# Patient Record
Sex: Female | Born: 1985
Health system: Southern US, Community
[De-identification: ages and names within clinical notes are randomized; demographics above are authoritative.]

## PROBLEM LIST (undated history)

## (undated) ENCOUNTER — Inpatient Hospital Stay (HOSPITAL_COMMUNITY): Payer: Self-pay

## (undated) DIAGNOSIS — Z9889 Other specified postprocedural states: Secondary | ICD-10-CM

## (undated) DIAGNOSIS — Z8619 Personal history of other infectious and parasitic diseases: Secondary | ICD-10-CM

## (undated) DIAGNOSIS — D473 Essential (hemorrhagic) thrombocythemia: Secondary | ICD-10-CM

## (undated) DIAGNOSIS — R87629 Unspecified abnormal cytological findings in specimens from vagina: Secondary | ICD-10-CM

## (undated) DIAGNOSIS — D75839 Thrombocytosis, unspecified: Secondary | ICD-10-CM

## (undated) HISTORY — PX: WISDOM TOOTH EXTRACTION: SHX21

---

## 2006-11-23 HISTORY — PX: LEEP: SHX91

## 2006-11-23 HISTORY — PX: DILATION AND CURETTAGE OF UTERUS: SHX78

## 2014-05-24 LAB — OB RESULTS CONSOLE ABO/RH: RH TYPE: POSITIVE

## 2014-05-24 LAB — OB RESULTS CONSOLE HIV ANTIBODY (ROUTINE TESTING): HIV: NONREACTIVE

## 2014-05-24 LAB — OB RESULTS CONSOLE RPR: RPR: NONREACTIVE

## 2014-05-24 LAB — OB RESULTS CONSOLE RUBELLA ANTIBODY, IGM: Rubella: IMMUNE

## 2014-05-24 LAB — OB RESULTS CONSOLE ANTIBODY SCREEN: ANTIBODY SCREEN: NEGATIVE

## 2014-08-03 ENCOUNTER — Inpatient Hospital Stay (HOSPITAL_COMMUNITY): Payer: BC Managed Care – PPO

## 2014-08-03 ENCOUNTER — Encounter (HOSPITAL_COMMUNITY): Payer: Self-pay | Admitting: *Deleted

## 2014-08-03 ENCOUNTER — Inpatient Hospital Stay (HOSPITAL_COMMUNITY)
Admission: AD | Admit: 2014-08-03 | Discharge: 2014-08-03 | Disposition: A | Discharge status: Home / Self Care | Payer: BC Managed Care – PPO | Source: Ambulatory Visit | Attending: Obstetrics & Gynecology | Admitting: Obstetrics & Gynecology

## 2014-08-03 DIAGNOSIS — O26879 Cervical shortening, unspecified trimester: Secondary | ICD-10-CM | POA: Diagnosis not present

## 2014-08-03 DIAGNOSIS — O4402 Placenta previa specified as without hemorrhage, second trimester: Secondary | ICD-10-CM

## 2014-08-03 DIAGNOSIS — R102 Pelvic and perineal pain: Secondary | ICD-10-CM

## 2014-08-03 DIAGNOSIS — N949 Unspecified condition associated with female genital organs and menstrual cycle: Secondary | ICD-10-CM | POA: Insufficient documentation

## 2014-08-03 DIAGNOSIS — O26872 Cervical shortening, second trimester: Secondary | ICD-10-CM

## 2014-08-03 DIAGNOSIS — O26892 Other specified pregnancy related conditions, second trimester: Secondary | ICD-10-CM

## 2014-08-03 DIAGNOSIS — O4400 Placenta previa specified as without hemorrhage, unspecified trimester: Secondary | ICD-10-CM | POA: Insufficient documentation

## 2014-08-03 LAB — URINALYSIS, ROUTINE W REFLEX MICROSCOPIC
Bilirubin Urine: NEGATIVE
Glucose, UA: NEGATIVE mg/dL
Hgb urine dipstick: NEGATIVE
Ketones, ur: NEGATIVE mg/dL
Leukocytes, UA: NEGATIVE
Nitrite: NEGATIVE
PH: 6 (ref 5.0–8.0)
Protein, ur: NEGATIVE mg/dL
SPECIFIC GRAVITY, URINE: 1.02 (ref 1.005–1.030)
Urobilinogen, UA: 0.2 mg/dL (ref 0.0–1.0)

## 2014-08-03 NOTE — MAU Note (Signed)
GAVE PB/ GRAHAM CRACKERS/ SPRITE

## 2014-08-03 NOTE — MAU Provider Note (Signed)
  History     CSN: [id number]  Arrival date and time: 08/03/14 1733 Orders placed in EPIC: [place]  First Provider Initiated Contact with Patient 08/03/14 2051      Chief Complaint  Patient presents with  . pelvic pressure    HPI  Ms. Alicia Lloyd is a 28 yo G1P0 female at 19.[redacted] wks gestation with a known placenta previa and shortened cervix presenting today with c/o increased pelvic pressure.  H/O LEEP. She had a CL on 9/8 that measured 2.2 cm.  She is currently on bedrest and take Prometrium 200 mg pv hs.  She denies VB or LOF.  She does report having an increase in vaginal d/c; which is very light brown color.  The vaginal d/c is not something new.  Her primary OB provider at [facility] is Dr. Benjie Karvonen.  History reviewed. No pertinent past medical history.  Past Surgical History  Procedure Laterality Date  . Leep  2008    History reviewed. No pertinent family history.  History  Substance Use Topics  . Smoking status: Never Smoker   . Smokeless tobacco: Never Used  . Alcohol Use: No    Allergies: No Known Allergies  Prescriptions prior to admission  Medication Sig Dispense Refill  . Prenatal Vit-Fe Fumarate-FA (PRENATAL MULTIVITAMIN) TABS tablet Take 1 tablet by mouth daily at 12 noon.      . progesterone (PROMETRIUM) 200 MG capsule Take 200 mg by mouth daily.        Review of Systems  Constitutional: Negative.   HENT: Negative.   Eyes: Negative.   Cardiovascular: Negative.   Gastrointestinal: Negative.   Genitourinary:       Pelvic pressure improved since being in MAU; very light brown watery vaginal d/c  Skin: Negative.   Neurological: Negative.   Endo/Heme/Allergies: Negative.   Psychiatric/Behavioral: Negative.    [place] Mfm Ob Transvaginal Cervical Length: 2.4 cm  Physical Exam   Blood pressure 110/75, pulse 82, temperature 98.1 F (36.7 C), temperature source Oral, resp. rate 18.  Physical Exam  Constitutional: She is oriented to person, place, and time. She  appears well-developed and well-nourished.  HENT:  Head: Normocephalic and atraumatic.  Eyes: Pupils are equal, round, and reactive to light.  Neck: Normal range of motion. Neck supple.  Cardiovascular: Normal rate, regular rhythm and normal heart sounds.   Respiratory: Effort normal and breath sounds normal.  GI: Soft. Bowel sounds are normal.  Genitourinary:  SSE: watery tan colored vaginal d/c; cervix visually closed  Musculoskeletal: Normal range of motion.  Neurological: She is alert and oriented to person, place, and time.  Skin: Skin is warm and dry.  Psychiatric: She has a normal mood and affect. Her behavior is normal. Judgment and thought content normal.    MAU Course  Procedures Transvaginal U/S for CL SSE Assessment and Plan  28 yo G1P0 @ 19.[redacted] wks gestation H/O LEEP affecting pregnancy Shortened cervix Pelvic Pain  Discharge Home Remain on bedrest and pelvic rest Continue vaginal Prometrium hs Keep appt with Dr. Benjie Karvonen 9/14 Call the office for worsening sx's  Dr. Benjie Karvonen notified of assessment / recommends D/C home  Graceann Congress MSN, CNM 08/03/2014, 8:54 PM

## 2014-08-03 NOTE — Discharge Instructions (Signed)
Placenta Previa  Placenta previa is a condition in pregnant women where the placenta implants in the lower part of the uterus. The placenta either partially or completely covers the opening to the cervix. This is a problem because the baby must pass through the cervix during delivery. There are three types of placenta previa. They include:  1. Marginal placenta previa. The placenta is near the cervix, but does not cover the opening. 2. Partial placenta previa. The placenta covers part of the cervical opening. 3. Complete placenta previa. The placenta covers the entire cervical opening.  Depending on the type of placenta previa, there is a chance the placenta may move into a normal position and no longer cover the cervix as the pregnancy progresses. It is important to keep all prenatal visits with your caregiver.  RISK FACTORS You may be more likely to develop placenta previa if you:   Are carrying more than one baby (multiples).   Have an abnormally shaped uterus.   Have scars on the lining of the uterus.   Had previous surgeries involving the uterus, such as a cesarean delivery.   Have delivered a baby previously.   Have a history of placenta previa.   Have smoked or used cocaine during pregnancy.   Are age 28 or older during pregnancy.  SYMPTOMS The main symptom of placenta previa is sudden, painless vaginal bleeding during the second half of pregnancy. The amount of bleeding can be light to very heavy. The bleeding may stop on its own, but almost always returns. Cramping, regular contractions, abdominal pain, and lower back pain can also occur with placenta previa.  DIAGNOSIS Placenta previa can be diagnosed through an ultrasound by finding where the placenta is located. The ultrasound may find placenta previa either during a routine prenatal visit or after vaginal bleeding is noticed. If you are diagnosed with placenta previa, your caregiver may avoid vaginal exams to reduce  the risk of heavy bleeding. There is a chance that placenta previa may not be diagnosed until bleeding occurs during labor.  TREATMENT Specific treatment depends on:   How much you are bleeding or if the bleeding has stopped.  How far along you are in your pregnancy.   The condition of the baby.   The location of the baby and placenta.   The type of placenta previa.  Depending on the factors above, your caregiver may recommend:   Decreased activity.   Bed rest at home or in the hospital.  Pelvic rest. This means no sex, using tampons, douching, pelvic exams, or placing anything into the vagina.  A blood transfusion to replace maternal blood loss.  A cesarean delivery if the bleeding is heavy and cannot be controlled or the placenta completely covers the cervix.  Medication to stop premature labor or mature the fetal lungs if delivery is needed before the pregnancy is full term.  WHEN SHOULD YOU SEEK IMMEDIATE MEDICAL CARE IF YOU ARE SENT HOME WITH PLACENTA PREVIA? Seek immediate medical care if you show any symptoms of placenta previa. You will need to go to the hospital to get checked immediately. Again, those symptoms are:  Sudden, painless vaginal bleeding, even a small amount.  Cramping or regular contractions.  Lower back or abdominal pain. Document Released: 11/09/2005 Document Revised: 07/12/2013 Document Reviewed: 02/10/2013 [facility] Patient Information 2015 [place], [place]. This information is not intended to replace advice given to you by your health care provider. Make sure you discuss any questions you have with your health  care provider.  Pelvic Rest Pelvic rest is sometimes recommended for women when:   The placenta is partially or completely covering the opening of the cervix (placenta previa).  There is bleeding between the uterine wall and the amniotic sac in the first trimester (subchorionic hemorrhage).  The cervix begins to open without labor  starting (incompetent cervix, cervical insufficiency).  The labor is too early (preterm labor). HOME CARE INSTRUCTIONS  Do not have sexual intercourse, stimulation, or an orgasm.  Do not use tampons, douche, or put anything in the vagina.  Do not lift anything over 10 pounds (4.5 kg).  Avoid strenuous activity or straining your pelvic muscles. SEEK MEDICAL CARE IF:  You have any vaginal bleeding during pregnancy. Treat this as a potential emergency.  You have cramping pain felt low in the stomach (stronger than menstrual cramps).  You notice vaginal discharge (watery, mucus, or bloody).  You have a low, dull backache.  There are regular contractions or uterine tightening. SEEK IMMEDIATE MEDICAL CARE IF: You have vaginal bleeding and have placenta previa.  Document Released: 03/06/2011 Document Revised: 02/01/2012 Document Reviewed: 03/06/2011 [facility] Patient Information 2015 [place], [place]. This information is not intended to replace advice given to you by your health care provider. Make sure you discuss any questions you have with your health care provider.

## 2014-08-03 NOTE — MAU Note (Signed)
Pelvic pressure & tightness started last night, went away with rest & hydration.  Hx of placenta previa & short cervix, on bedrest.  Pelvic pressure started again around 1300, denies bleeding.

## 2014-08-04 NOTE — MAU Provider Note (Signed)
Reviewed and agree with note and plan. V.Chevon Fomby, MD  

## 2014-09-25 ENCOUNTER — Encounter (HOSPITAL_COMMUNITY): Payer: Self-pay | Admitting: *Deleted

## 2014-09-28 ENCOUNTER — Encounter (HOSPITAL_COMMUNITY): Payer: Self-pay | Admitting: *Deleted

## 2014-09-28 ENCOUNTER — Inpatient Hospital Stay (HOSPITAL_COMMUNITY)
Admission: AD | Admit: 2014-09-28 | Discharge: 2014-09-28 | Disposition: A | Discharge status: Home / Self Care | Payer: BC Managed Care – PPO | Source: Ambulatory Visit | Attending: Obstetrics and Gynecology | Admitting: Obstetrics and Gynecology

## 2014-09-28 ENCOUNTER — Inpatient Hospital Stay (HOSPITAL_COMMUNITY): Payer: BC Managed Care – PPO

## 2014-09-28 DIAGNOSIS — K5900 Constipation, unspecified: Secondary | ICD-10-CM | POA: Diagnosis not present

## 2014-09-28 DIAGNOSIS — O4420 Partial placenta previa NOS or without hemorrhage, unspecified trimester: Secondary | ICD-10-CM | POA: Insufficient documentation

## 2014-09-28 DIAGNOSIS — O4700 False labor before 37 completed weeks of gestation, unspecified trimester: Secondary | ICD-10-CM | POA: Insufficient documentation

## 2014-09-28 DIAGNOSIS — O479 False labor, unspecified: Secondary | ICD-10-CM | POA: Insufficient documentation

## 2014-09-28 DIAGNOSIS — O99612 Diseases of the digestive system complicating pregnancy, second trimester: Secondary | ICD-10-CM

## 2014-09-28 DIAGNOSIS — O4702 False labor before 37 completed weeks of gestation, second trimester: Secondary | ICD-10-CM

## 2014-09-28 DIAGNOSIS — Z3A27 27 weeks gestation of pregnancy: Secondary | ICD-10-CM | POA: Insufficient documentation

## 2014-09-28 HISTORY — DX: Unspecified abnormal cytological findings in specimens from vagina: R87.629

## 2014-09-28 LAB — FETAL FIBRONECTIN: Fetal Fibronectin: NEGATIVE

## 2014-09-28 NOTE — Discharge Instructions (Signed)
Braxton Hicks Contractions °Contractions of the uterus can occur throughout pregnancy. Contractions are not always a sign that you are in labor.  °WHAT ARE BRAXTON HICKS CONTRACTIONS?  °Contractions that occur before labor are called Braxton Hicks contractions, or false labor. Toward the end of pregnancy (32-34 weeks), these contractions can develop more often and may become more forceful. This is not true labor because these contractions do not result in opening (dilatation) and thinning of the cervix. They are sometimes difficult to tell apart from true labor because these contractions can be forceful and people have different pain tolerances. You should not feel embarrassed if you go to the hospital with false labor. Sometimes, the only way to tell if you are in true labor is for your health care provider to look for changes in the cervix. °If there are no prenatal problems or other health problems associated with the pregnancy, it is completely safe to be sent home with false labor and await the onset of true labor. °HOW CAN YOU TELL THE DIFFERENCE BETWEEN TRUE AND FALSE LABOR? °False Labor °· The contractions of false labor are usually shorter and not as hard as those of true labor.   °· The contractions are usually irregular.   °· The contractions are often felt in the front of the lower abdomen and in the groin.   °· The contractions may go away when you walk around or change positions while lying down.   °· The contractions get weaker and are shorter lasting as time goes on.   °· The contractions do not usually become progressively stronger, regular, and closer together as with true labor.   °True Labor °· Contractions in true labor last 30-70 seconds, become very regular, usually become more intense, and increase in frequency.   °· The contractions do not go away with walking.   °· The discomfort is usually felt in the top of the uterus and spreads to the lower abdomen and low back.   °· True labor can be  determined by your health care provider with an exam. This will show that the cervix is dilating and getting thinner.   °WHAT TO REMEMBER °· Keep up with your usual exercises and follow other instructions given by your health care provider.   °· Take medicines as directed by your health care provider.   °· Keep your regular prenatal appointments.   °· Eat and drink lightly if you think you are going into labor.   °· If Braxton Hicks contractions are making you uncomfortable:   °¨ Change your position from lying down or resting to walking, or from walking to resting.   °¨ Sit and rest in a tub of warm water.   °¨ Drink 2-3 glasses of water. Dehydration may cause these contractions.   °¨ Do slow and deep breathing several times an hour.   °WHEN SHOULD I SEEK IMMEDIATE MEDICAL CARE? °Seek immediate medical care if: °· Your contractions become stronger, more regular, and closer together.   °· You have fluid leaking or gushing from your vagina.   °· You have a fever.   °· You pass blood-tinged mucus.   °· You have vaginal bleeding.   °· You have continuous abdominal pain.   °· You have low back pain that you never had before.   °· You feel your baby's head pushing down and causing pelvic pressure.   °· Your baby is not moving as much as it used to.   °Document Released: 11/09/2005 Document Revised: 11/14/2013 Document Reviewed: 08/21/2013 °[employer]® Patient Information ©2015 [employer]. This information is not intended to replace advice given to you by your health care   provider. Make sure you discuss any questions you have with your health care provider. ° °

## 2014-09-28 NOTE — MAU Note (Signed)
Pt states she has been feeling uc's for the past 3 days, was not sure what they were.  Was seen in MD office, sent to MAU for further evaluation.  Denies bleeding or LOF.

## 2014-09-28 NOTE — MAU Provider Note (Signed)
  History     CSN: [id number]  Arrival date and time: 09/28/14 1603    Chief Complaint  Patient presents with  . Contractions   HPI Comments: G2P0010 @27 .4 wks sent from office after being evaluated for ctx. Having BH ctx x3 days and increased pelvic pressure today, wasn't aware sx were actual ctx until discussed with doula. Pregnancy complicated by hx of LEEP, placenta previa, and shortened cervix. Last CL 3 weeks ago 2.9 cm and stable. Taking vaginal Prometrium. Speculum exam in office visually closed. Reports chronic constipation since pregnant but had BM today.    Past Medical History  Diagnosis Date  . Vaginal Pap smear, abnormal     Past Surgical History  Procedure Laterality Date  . Leep  2008  . Wisdom tooth extraction      History reviewed. No pertinent family history.  History  Substance Use Topics  . Smoking status: Never Smoker   . Smokeless tobacco: Never Used  . Alcohol Use: No    Allergies: No Known Allergies  Prescriptions prior to admission  Medication Sig Dispense Refill Last Dose  . calcium carbonate (TUMS - DOSED IN MG ELEMENTAL CALCIUM) 500 MG chewable tablet Chew 2 tablets by mouth daily as needed for indigestion or heartburn.   Past Week at Unknown time  . Docosahexaenoic Acid (DHA PO) Take 1 tablet by mouth daily.   09/27/2014 at Unknown time  . Prenatal Vit-Fe Fumarate-FA (PRENATAL MULTIVITAMIN) TABS tablet Take 1 tablet by mouth daily at 12 noon.   09/27/2014 at Unknown time  . progesterone (PROMETRIUM) 200 MG capsule Take 200 mg by mouth daily.   09/27/2014 at Unknown time    Review of Systems  Constitutional: Negative.   HENT: Negative.   Eyes: Negative.   Respiratory: Negative.   Cardiovascular: Negative.   Gastrointestinal: Positive for constipation.  Genitourinary: Negative.   Musculoskeletal: Negative.   Skin: Negative.   Neurological: Negative.   Endo/Heme/Allergies: Negative.   Psychiatric/Behavioral: Negative.    Physical Exam    Blood pressure 111/71, pulse 79, temperature 98.4 F (36.9 C), temperature source Oral, resp. rate 18.  Physical Exam  Constitutional: She is oriented to person, place, and time. She appears well-developed and well-nourished.  HENT:  Head: Normocephalic.  Eyes: Pupils are equal, round, and reactive to light.  Neck: Normal range of motion. Neck supple.  Cardiovascular: Normal rate.   Respiratory: Effort normal.  GI: Soft. There is no tenderness.  gravid  Genitourinary:  deferred  Musculoskeletal: Normal range of motion.  Neurological: She is alert and oriented to person, place, and time.  Skin: Skin is warm and dry.  Psychiatric: She has a normal mood and affect.   FHT: 135 bpm, mod variability, +accels, no decels Toco: irregular, irritability  MAU Course  Procedures  MDM Sono for CL and placenta-marginal previa, CL 2.6 cm FFN (collected in office)-negative  Assessment and Plan  27.[redacted] weeks gestation Preterm contractions Constipation  Discharge home Modified BR, pelvic rest Continue Prometrium Mag Oxide 200 mg po 1-2 tabs daily prn constipation Follow-up in office next week as scheduled, OOW until then Discussed A/P with Dr Ronita Hipps, agrees  Julianne Handler, N 09/28/2014, 5:40 PM

## 2014-09-28 NOTE — Progress Notes (Signed)
CNM informed that Dr. Ronita Hipps requests she reevaluate pt & that FFN was ordered.  CNM states she is ordering an U/S & will come see the pt.

## 2014-09-28 NOTE — Progress Notes (Signed)
MD notified of pt status, order received to send FFN, states to monitor pt for one hour & call Colman Cater CNM

## 2014-09-29 DIAGNOSIS — O4700 False labor before 37 completed weeks of gestation, unspecified trimester: Secondary | ICD-10-CM | POA: Insufficient documentation

## 2014-09-29 DIAGNOSIS — O4420 Partial placenta previa NOS or without hemorrhage, unspecified trimester: Secondary | ICD-10-CM | POA: Insufficient documentation

## 2014-09-29 DIAGNOSIS — O479 False labor, unspecified: Secondary | ICD-10-CM | POA: Insufficient documentation

## 2014-09-29 DIAGNOSIS — Z3A27 27 weeks gestation of pregnancy: Secondary | ICD-10-CM | POA: Insufficient documentation

## 2014-12-11 ENCOUNTER — Other Ambulatory Visit: Payer: Self-pay | Admitting: Obstetrics and Gynecology

## 2014-12-13 ENCOUNTER — Encounter (HOSPITAL_COMMUNITY): Payer: Self-pay

## 2014-12-14 ENCOUNTER — Inpatient Hospital Stay (HOSPITAL_COMMUNITY): Admission: RE | Admit: 2014-12-14 | Payer: BC Managed Care – PPO | Source: Ambulatory Visit

## 2014-12-17 ENCOUNTER — Encounter (HOSPITAL_COMMUNITY)
Admission: AD | Disposition: A | Discharge status: Home / Self Care | Payer: Self-pay | Source: Ambulatory Visit | Attending: Obstetrics and Gynecology

## 2014-12-17 ENCOUNTER — Inpatient Hospital Stay (HOSPITAL_COMMUNITY)
Admission: AD | Admit: 2014-12-17 | Discharge: 2014-12-19 | DRG: 765 | Disposition: A | Discharge status: Home / Self Care | Payer: BC Managed Care – PPO | Source: Ambulatory Visit | Attending: Obstetrics and Gynecology | Admitting: Obstetrics and Gynecology

## 2014-12-17 ENCOUNTER — Inpatient Hospital Stay (HOSPITAL_COMMUNITY): Payer: BC Managed Care – PPO | Admitting: Anesthesiology

## 2014-12-17 ENCOUNTER — Encounter (HOSPITAL_COMMUNITY): Payer: Self-pay

## 2014-12-17 DIAGNOSIS — Z3A39 39 weeks gestation of pregnancy: Secondary | ICD-10-CM | POA: Diagnosis present

## 2014-12-17 DIAGNOSIS — O9912 Other diseases of the blood and blood-forming organs and certain disorders involving the immune mechanism complicating childbirth: Secondary | ICD-10-CM | POA: Diagnosis present

## 2014-12-17 DIAGNOSIS — D682 Hereditary deficiency of other clotting factors: Secondary | ICD-10-CM | POA: Diagnosis present

## 2014-12-17 DIAGNOSIS — D6959 Other secondary thrombocytopenia: Secondary | ICD-10-CM | POA: Diagnosis present

## 2014-12-17 DIAGNOSIS — O4403 Placenta previa specified as without hemorrhage, third trimester: Principal | ICD-10-CM | POA: Diagnosis present

## 2014-12-17 HISTORY — DX: Other specified postprocedural states: Z98.890

## 2014-12-17 HISTORY — DX: Essential (hemorrhagic) thrombocythemia: D47.3

## 2014-12-17 HISTORY — DX: Thrombocytosis, unspecified: D75.839

## 2014-12-17 HISTORY — DX: Personal history of other infectious and parasitic diseases: Z86.19

## 2014-12-17 LAB — TYPE AND SCREEN
ABO/RH(D): O POS
Antibody Screen: NEGATIVE

## 2014-12-17 LAB — CBC
HCT: 38.7 % (ref 36.0–46.0)
HEMOGLOBIN: 13.5 g/dL (ref 12.0–15.0)
MCH: 33.2 pg (ref 26.0–34.0)
MCHC: 34.9 g/dL (ref 30.0–36.0)
MCV: 95.1 fL (ref 78.0–100.0)
Platelets: 134 10*3/uL — ABNORMAL LOW (ref 150–400)
RBC: 4.07 MIL/uL (ref 3.87–5.11)
RDW: 12.8 % (ref 11.5–15.5)
WBC: 12.3 10*3/uL — AB (ref 4.0–10.5)

## 2014-12-17 LAB — ABO/RH: ABO/RH(D): O POS

## 2014-12-17 SURGERY — Surgical Case
Anesthesia: Spinal | Site: Abdomen

## 2014-12-17 MED ORDER — NALBUPHINE HCL 10 MG/ML IJ SOLN: 5.0000 mg | Freq: Once | INTRAMUSCULAR | Status: AC | PRN

## 2014-12-17 MED ORDER — MORPHINE SULFATE 0.5 MG/ML IJ SOLN
INTRAMUSCULAR | Status: AC
  Filled 2014-12-17: qty 10

## 2014-12-17 MED ORDER — DIPHENHYDRAMINE HCL 25 MG PO CAPS: 25.0000 mg | ORAL_CAPSULE | Freq: Four times a day (QID) | ORAL | Status: DC | PRN

## 2014-12-17 MED ORDER — ONDANSETRON HCL 4 MG/2ML IJ SOLN
INTRAMUSCULAR | Status: DC | PRN
  Administered 2014-12-17: 4 mg via INTRAVENOUS

## 2014-12-17 MED ORDER — SODIUM CHLORIDE 0.9 % IJ SOLN: 3.0000 mL | Freq: Two times a day (BID) | INTRAMUSCULAR | Status: DC

## 2014-12-17 MED ORDER — FENTANYL CITRATE 0.05 MG/ML IJ SOLN
INTRAMUSCULAR | Status: AC
  Administered 2014-12-17: 50 ug via INTRAVENOUS
  Filled 2014-12-17: qty 2

## 2014-12-17 MED ORDER — LANOLIN HYDROUS EX OINT: 1.0000 "application " | TOPICAL_OINTMENT | CUTANEOUS | Status: DC | PRN

## 2014-12-17 MED ORDER — PHENYLEPHRINE 8 MG IN D5W 100 ML (0.08MG/ML) PREMIX OPTIME
INJECTION | INTRAVENOUS | Status: DC | PRN
  Administered 2014-12-17: 60 ug/min via INTRAVENOUS

## 2014-12-17 MED ORDER — FENTANYL CITRATE 0.05 MG/ML IJ SOLN
INTRAMUSCULAR | Status: DC | PRN
  Administered 2014-12-17: 25 ug via INTRATHECAL

## 2014-12-17 MED ORDER — PRENATAL MULTIVITAMIN CH
1.0000 | ORAL_TABLET | Freq: Every day | ORAL | Status: DC
  Filled 2014-12-17: qty 1

## 2014-12-17 MED ORDER — FENTANYL CITRATE 0.05 MG/ML IJ SOLN
25.0000 ug | INTRAMUSCULAR | Status: DC | PRN
  Administered 2014-12-17: 50 ug via INTRAVENOUS
  Administered 2014-12-17: 0.5 ug via INTRAVENOUS

## 2014-12-17 MED ORDER — BISACODYL 10 MG RE SUPP: 10.0000 mg | Freq: Every day | RECTAL | Status: DC | PRN

## 2014-12-17 MED ORDER — ONDANSETRON HCL 4 MG PO TABS: 4.0000 mg | ORAL_TABLET | ORAL | Status: DC | PRN

## 2014-12-17 MED ORDER — ZOLPIDEM TARTRATE 5 MG PO TABS: 5.0000 mg | ORAL_TABLET | Freq: Every evening | ORAL | Status: DC | PRN

## 2014-12-17 MED ORDER — METHYLERGONOVINE MALEATE 0.2 MG PO TABS: 0.2000 mg | ORAL_TABLET | ORAL | Status: DC | PRN

## 2014-12-17 MED ORDER — OXYCODONE-ACETAMINOPHEN 5-325 MG PO TABS
2.0000 | ORAL_TABLET | ORAL | Status: DC | PRN
  Administered 2014-12-18 – 2014-12-19 (×4): 2 via ORAL
  Filled 2014-12-17 (×5): qty 2

## 2014-12-17 MED ORDER — ONDANSETRON HCL 4 MG/2ML IJ SOLN: 4.0000 mg | Freq: Three times a day (TID) | INTRAMUSCULAR | Status: DC | PRN

## 2014-12-17 MED ORDER — DIBUCAINE 1 % RE OINT: 1.0000 "application " | TOPICAL_OINTMENT | RECTAL | Status: DC | PRN

## 2014-12-17 MED ORDER — OXYTOCIN 10 UNIT/ML IJ SOLN
INTRAMUSCULAR | Status: AC
  Filled 2014-12-17: qty 4

## 2014-12-17 MED ORDER — CEFAZOLIN SODIUM-DEXTROSE 2-3 GM-% IV SOLR
INTRAVENOUS | Status: AC
  Filled 2014-12-17: qty 50

## 2014-12-17 MED ORDER — OXYCODONE-ACETAMINOPHEN 5-325 MG PO TABS
1.0000 | ORAL_TABLET | ORAL | Status: DC | PRN
  Administered 2014-12-18 – 2014-12-19 (×3): 1 via ORAL
  Filled 2014-12-17 (×2): qty 1

## 2014-12-17 MED ORDER — NALOXONE HCL 0.4 MG/ML IJ SOLN: 0.4000 mg | INTRAMUSCULAR | Status: DC | PRN

## 2014-12-17 MED ORDER — DIPHENHYDRAMINE HCL 25 MG PO CAPS
25.0000 mg | ORAL_CAPSULE | ORAL | Status: DC | PRN
  Filled 2014-12-17: qty 1

## 2014-12-17 MED ORDER — SCOPOLAMINE 1 MG/3DAYS TD PT72
1.0000 | MEDICATED_PATCH | Freq: Once | TRANSDERMAL | Status: DC
  Administered 2014-12-17: 1.5 mg via TRANSDERMAL

## 2014-12-17 MED ORDER — SODIUM CHLORIDE 0.9 % IJ SOLN: 3.0000 mL | INTRAMUSCULAR | Status: DC | PRN

## 2014-12-17 MED ORDER — MENTHOL 3 MG MT LOZG: 1.0000 | LOZENGE | OROMUCOSAL | Status: DC | PRN

## 2014-12-17 MED ORDER — MEPERIDINE HCL 25 MG/ML IJ SOLN: 6.2500 mg | INTRAMUSCULAR | Status: DC | PRN

## 2014-12-17 MED ORDER — BUPIVACAINE HCL (PF) 0.25 % IJ SOLN
INTRAMUSCULAR | Status: AC
  Filled 2014-12-17: qty 30

## 2014-12-17 MED ORDER — LACTATED RINGERS IV SOLN
Freq: Once | INTRAVENOUS | Status: AC
  Administered 2014-12-17: 10:00:00 via INTRAVENOUS

## 2014-12-17 MED ORDER — SCOPOLAMINE 1 MG/3DAYS TD PT72
MEDICATED_PATCH | TRANSDERMAL | Status: AC
  Administered 2014-12-17: 1.5 mg via TRANSDERMAL
  Filled 2014-12-17: qty 1

## 2014-12-17 MED ORDER — CEFAZOLIN SODIUM-DEXTROSE 2-3 GM-% IV SOLR
2.0000 g | INTRAVENOUS | Status: AC
  Administered 2014-12-17: 2 g via INTRAVENOUS

## 2014-12-17 MED ORDER — DEXTROSE 5 % IV SOLN
1.0000 ug/kg/h | INTRAVENOUS | Status: DC | PRN
  Filled 2014-12-17: qty 2

## 2014-12-17 MED ORDER — SIMETHICONE 80 MG PO CHEW: 80.0000 mg | CHEWABLE_TABLET | ORAL | Status: DC

## 2014-12-17 MED ORDER — OXYTOCIN 40 UNITS IN LACTATED RINGERS INFUSION - SIMPLE MED
INTRAVENOUS | Status: DC | PRN
  Administered 2014-12-17: 40 [IU] via INTRAVENOUS

## 2014-12-17 MED ORDER — SIMETHICONE 80 MG PO CHEW
80.0000 mg | CHEWABLE_TABLET | Freq: Three times a day (TID) | ORAL | Status: DC
  Filled 2014-12-17: qty 1

## 2014-12-17 MED ORDER — MORPHINE SULFATE (PF) 0.5 MG/ML IJ SOLN
INTRAMUSCULAR | Status: DC | PRN
  Administered 2014-12-17: .15 mg via INTRATHECAL

## 2014-12-17 MED ORDER — BUPIVACAINE IN DEXTROSE 0.75-8.25 % IT SOLN
INTRATHECAL | Status: DC | PRN
Start: 2014-12-17 — End: 2014-12-17
  Administered 2014-12-17: 16 mL via INTRATHECAL

## 2014-12-17 MED ORDER — NALBUPHINE HCL 10 MG/ML IJ SOLN: 5.0000 mg | INTRAMUSCULAR | Status: DC | PRN

## 2014-12-17 MED ORDER — FENTANYL CITRATE 0.05 MG/ML IJ SOLN
INTRAMUSCULAR | Status: AC
  Filled 2014-12-17: qty 2

## 2014-12-17 MED ORDER — METHYLERGONOVINE MALEATE 0.2 MG/ML IJ SOLN: 0.2000 mg | INTRAMUSCULAR | Status: DC | PRN

## 2014-12-17 MED ORDER — FLEET ENEMA 7-19 GM/118ML RE ENEM: 1.0000 | ENEMA | Freq: Every day | RECTAL | Status: DC | PRN

## 2014-12-17 MED ORDER — WITCH HAZEL-GLYCERIN EX PADS: 1.0000 "application " | MEDICATED_PAD | CUTANEOUS | Status: DC | PRN

## 2014-12-17 MED ORDER — SIMETHICONE 80 MG PO CHEW: 80.0000 mg | CHEWABLE_TABLET | ORAL | Status: DC | PRN

## 2014-12-17 MED ORDER — IBUPROFEN 600 MG PO TABS: 600.0000 mg | ORAL_TABLET | Freq: Four times a day (QID) | ORAL | Status: DC

## 2014-12-17 MED ORDER — PHENYLEPHRINE 8 MG IN D5W 100 ML (0.08MG/ML) PREMIX OPTIME
INJECTION | INTRAVENOUS | Status: AC
  Filled 2014-12-17: qty 100

## 2014-12-17 MED ORDER — SODIUM CHLORIDE 0.9 % IV SOLN: 250.0000 mL | INTRAVENOUS | Status: DC

## 2014-12-17 MED ORDER — BUPIVACAINE HCL (PF) 0.25 % IJ SOLN
INTRAMUSCULAR | Status: AC
  Filled 2014-12-17: qty 10

## 2014-12-17 MED ORDER — OXYTOCIN 40 UNITS IN LACTATED RINGERS INFUSION - SIMPLE MED: 62.5000 mL/h | INTRAVENOUS | Status: AC

## 2014-12-17 MED ORDER — DIPHENHYDRAMINE HCL 50 MG/ML IJ SOLN
12.5000 mg | INTRAMUSCULAR | Status: DC | PRN
  Administered 2014-12-17: 12.5 mg via INTRAVENOUS

## 2014-12-17 MED ORDER — ONDANSETRON HCL 4 MG/2ML IJ SOLN: 4.0000 mg | INTRAMUSCULAR | Status: DC | PRN

## 2014-12-17 MED ORDER — ONDANSETRON HCL 4 MG/2ML IJ SOLN
INTRAMUSCULAR | Status: AC
  Filled 2014-12-17: qty 2

## 2014-12-17 MED ORDER — BUPIVACAINE HCL (PF) 0.25 % IJ SOLN
INTRAMUSCULAR | Status: DC | PRN
  Administered 2014-12-17: 6 mL

## 2014-12-17 MED ORDER — LACTATED RINGERS IV SOLN
INTRAVENOUS | Status: DC
  Administered 2014-12-17 (×3): via INTRAVENOUS

## 2014-12-17 MED ORDER — FERROUS SULFATE 325 (65 FE) MG PO TABS
325.0000 mg | ORAL_TABLET | Freq: Two times a day (BID) | ORAL | Status: DC
  Filled 2014-12-17: qty 1

## 2014-12-17 MED ORDER — SENNOSIDES-DOCUSATE SODIUM 8.6-50 MG PO TABS: 2.0000 | ORAL_TABLET | ORAL | Status: DC

## 2014-12-17 MED ORDER — DIPHENHYDRAMINE HCL 50 MG/ML IJ SOLN
INTRAMUSCULAR | Status: AC
  Filled 2014-12-17: qty 1

## 2014-12-17 SURGICAL SUPPLY — 39 items
BARRIER ADHS 3X4 INTERCEED (GAUZE/BANDAGES/DRESSINGS) ×2 IMPLANT
BENZOIN TINCTURE PRP APPL 2/3 (GAUZE/BANDAGES/DRESSINGS) ×2 IMPLANT
CLAMP CORD UMBIL (MISCELLANEOUS) IMPLANT
CLOTH BEACON ORANGE TIMEOUT ST (SAFETY) ×2 IMPLANT
CONTAINER PREFILL 10% NBF 15ML (MISCELLANEOUS) IMPLANT
DECANTER SPIKE VIAL GLASS SM (MISCELLANEOUS) ×2 IMPLANT
DRAPE SHEET LG 3/4 BI-LAMINATE (DRAPES) IMPLANT
DRSG OPSITE POSTOP 4X10 (GAUZE/BANDAGES/DRESSINGS) ×2 IMPLANT
DURAPREP 26ML APPLICATOR (WOUND CARE) ×2 IMPLANT
ELECT REM PT RETURN 9FT ADLT (ELECTROSURGICAL) ×2
ELECTRODE REM PT RTRN 9FT ADLT (ELECTROSURGICAL) ×1 IMPLANT
EXTRACTOR VACUUM M CUP 4 TUBE (SUCTIONS) IMPLANT
GLOVE BIOGEL PI IND STRL 7.0 (GLOVE) ×1 IMPLANT
GLOVE BIOGEL PI INDICATOR 7.0 (GLOVE) ×1
GLOVE ECLIPSE 6.5 STRL STRAW (GLOVE) ×2 IMPLANT
GOWN STRL REUS W/TWL LRG LVL3 (GOWN DISPOSABLE) ×4 IMPLANT
KIT ABG SYR 3ML LUER SLIP (SYRINGE) IMPLANT
NEEDLE HYPO 25X1 1.5 SAFETY (NEEDLE) ×2 IMPLANT
NEEDLE HYPO 25X5/8 SAFETYGLIDE (NEEDLE) IMPLANT
NS IRRIG 1000ML POUR BTL (IV SOLUTION) ×2 IMPLANT
PACK C SECTION WH (CUSTOM PROCEDURE TRAY) ×2 IMPLANT
PAD OB MATERNITY 4.3X12.25 (PERSONAL CARE ITEMS) ×2 IMPLANT
RTRCTR C-SECT PINK 25CM LRG (MISCELLANEOUS) IMPLANT
STAPLER VISISTAT 35W (STAPLE) IMPLANT
STRIP CLOSURE SKIN 1/2X4 (GAUZE/BANDAGES/DRESSINGS) ×2 IMPLANT
SUT CHROMIC GUT AB #0 18 (SUTURE) IMPLANT
SUT MNCRL 0 VIOLET CTX 36 (SUTURE) ×3 IMPLANT
SUT MON AB 4-0 PS1 27 (SUTURE) IMPLANT
SUT MONOCRYL 0 CTX 36 (SUTURE) ×3
SUT PLAIN 2 0 (SUTURE) ×1
SUT PLAIN 2 0 XLH (SUTURE) ×2 IMPLANT
SUT PLAIN ABS 2-0 CT1 27XMFL (SUTURE) ×1 IMPLANT
SUT VIC AB 0 CT1 36 (SUTURE) ×4 IMPLANT
SUT VIC AB 2-0 CT1 27 (SUTURE) ×1
SUT VIC AB 2-0 CT1 TAPERPNT 27 (SUTURE) ×1 IMPLANT
SUT VIC AB 4-0 PS2 27 (SUTURE) ×2 IMPLANT
SYR CONTROL 10ML LL (SYRINGE) ×2 IMPLANT
TOWEL OR 17X24 6PK STRL BLUE (TOWEL DISPOSABLE) ×2 IMPLANT
TRAY FOLEY CATH 14FR (SET/KITS/TRAYS/PACK) IMPLANT

## 2014-12-17 NOTE — Anesthesia Procedure Notes (Signed)
Spinal Patient location during procedure: OR Start time: 12/17/2014 11:36 AM Staffing Anesthesiologist: Bob Daversa A. Performed by: anesthesiologist  Preanesthetic Checklist Completed: patient identified, site marked, surgical consent, pre-op evaluation, timeout performed, IV checked, risks and benefits discussed and monitors and equipment checked Spinal Block Patient position: sitting Prep: site prepped and draped and DuraPrep Patient monitoring: heart rate, cardiac monitor, continuous pulse ox and blood pressure Approach: midline Location: L3-4 Injection technique: single-shot Needle Needle type: Sprotte  Needle gauge: 24 G Needle length: 9 cm Needle insertion depth: 5 cm Assessment Sensory level: T4 Additional Notes Patient tolerated procedure well.. Adequate sensory level.

## 2014-12-17 NOTE — Anesthesia Postprocedure Evaluation (Signed)
Anesthesia Post Note  Patient: Alicia Lloyd  Procedure(s) Performed: Procedure(s) (LRB): Primary CESAREAN SECTION (N/A)  Anesthesia type: Spinal  Patient location: Mother/Baby  Post pain: Pain level controlled  Post assessment: Post-op Vital signs reviewed  Last Vitals:  Filed Vitals:   12/17/14 1630  BP: 114/64  Pulse: 72  Temp: 36.9 C  Resp: 20    Post vital signs: Reviewed  Level of consciousness: awake  Complications: No apparent anesthesia complications

## 2014-12-17 NOTE — Transfer of Care (Signed)
Immediate Anesthesia Transfer of Care Note  Patient: Alicia Lloyd  Procedure(s) Performed: Procedure(s) with comments: Primary CESAREAN SECTION (N/A) - EDD: 12/24/14  Patient Location: PACU  Anesthesia Type:Spinal  Level of Consciousness: awake  Airway & Oxygen Therapy: Patient Spontanous Breathing  Post-op Assessment: Report given to PACU RN  Post vital signs: Reviewed and stable  Complications: No apparent anesthesia complications

## 2014-12-17 NOTE — Anesthesia Preprocedure Evaluation (Signed)
Anesthesia Evaluation  Patient identified by MRN, date of birth, ID band Patient awake    Reviewed: Allergy & Precautions, NPO status , Patient's Chart, lab work & pertinent test results  Airway Mallampati: II       Dental no notable dental hx. (+) Teeth Intact   Pulmonary neg pulmonary ROS,  breath sounds clear to auscultation  Pulmonary exam normal       Cardiovascular negative cardio ROS  Rhythm:Regular Rate:Normal     Neuro/Psych negative neurological ROS  negative psych ROS   GI/Hepatic Neg liver ROS, GERD-  Medicated and Controlled,  Endo/Other  negative endocrine ROS  Renal/GU negative Renal ROS  negative genitourinary   Musculoskeletal negative musculoskeletal ROS (+)   Abdominal (+) + obese,   Peds  Hematology negative hematology ROS (+)   Anesthesia Other Findings   Reproductive/Obstetrics (+) Pregnancy Hx/o placenta previa now low lying placenta Gestational Thromobocytopenia Hx/o 3rd trimester bleeding                             Anesthesia Physical Anesthesia Plan  ASA: II  Anesthesia Plan: Spinal   Post-op Pain Management:    Induction: Intravenous  Airway Management Planned: Natural Airway  Additional Equipment:   Intra-op Plan:   Post-operative Plan: Extubation in OR  Informed Consent: I have reviewed the patients History and Physical, chart, labs and discussed the procedure including the risks, benefits and alternatives for the proposed anesthesia with the patient or authorized representative who has indicated his/her understanding and acceptance.   Dental advisory given  Plan Discussed with: Anesthesiologist, CRNA and Surgeon  Anesthesia Plan Comments:         Anesthesia Quick Evaluation

## 2014-12-17 NOTE — Lactation Note (Addendum)
This note was copied from the chart of [name]. Lactation Consultation Note  Patient Name: [name] [id number] Date: 12/17/2014 Reason for consult: Initial assessment of this mom and baby at 8 hours pp.  This is mom's first baby and she delivered by C/S but she has a private doula who is also an Building services engineer.  [name],,Doula/LC arrived at 2015 but prior to this, [facility] provided [facility] Resource packet and reviewed importance of frequent STS, cue feedings and hand expressed ebm for nipple care and to encourage baby to open wide for latch. LC encouraged review of Baby and Me pp 9, 14 and 20-25 for STS and BF information. LC provided [employer] Resource brochure and reviewed [facility] services and list of community and web site resources.    Maternal Data Formula Feeding for Exclusion: No Has patient been taught Hand Expression?: Yes (LC demonstrated hand expresion technique and encouraged for nipple care and to encourage deep latch) Does the patient have breastfeeding experience prior to this delivery?: No  Feeding Feeding Type: Breast Fed  LATCH Score/Interventions            Initial LATCH score=9 per RN assessment and baby has breastfed twice since delivery          Lactation Tools Discussed/Used    STS, cue feedings, hand expression and nipple care  Consult Status Consult Status: Follow-up Date: 12/18/14 Follow-up type: In-patient    Junious Dresser [facility] 12/17/2014, 8:17 PM

## 2014-12-17 NOTE — Brief Op Note (Signed)
12/17/2014  12:47 PM  PATIENT:  Alicia Lloyd  29 y.o. female  PRE-OPERATIVE DIAGNOSIS:  Low Lying Marginal Placenta, Gestational Thrombocytopenia, History of 3rd trimester bleeding, term gestation  POST-OPERATIVE DIAGNOSIS:  Marginal Placenta, Gestational Thrombocytopenia, History of 3rd trimester bleeding, term gestation  PROCEDURE:  Primary Cesarean section, kerr hysterotomy  SURGEON:  Surgeon(s) and Role:    * [facility] Handyside Clint Bolder, MD - Primary  PHYSICIAN ASSISTANT:   ASSISTANTS: Laury Deep, CNM   ANESTHESIA:   spinal Findings: live female with CAN x 2, nl tubes and ovaries, right small paratubal cyst, marginal placenta.Apgar 9/10 EBL:  Total I/O In: 2400 [I.V.:2400] Out: 800 [Urine:100; Blood:700]  BLOOD ADMINISTERED:none  DRAINS: none   LOCAL MEDICATIONS USED:  MARCAINE     SPECIMEN:  Source of Specimen:  placenta  DISPOSITION OF SPECIMEN:  N/A  COUNTS:  YES  TOURNIQUET:  * No tourniquets in log *  DICTATION: .Other Dictation: Dictation Number [id number]  PLAN OF CARE: Admit to inpatient   PATIENT DISPOSITION:  PACU - hemodynamically stable.   Delay start of Pharmacological VTE agent (>24hrs) due to surgical blood loss or risk of bleeding: no

## 2014-12-17 NOTE — Addendum Note (Signed)
Addendum  created 12/17/14 1706 by Flossie Dibble, CRNA   Modules edited: Notes Section   Notes Section:  File: [id number]

## 2014-12-17 NOTE — Consult Note (Signed)
Neonatology Note:   Attendance at C-section:    I was asked by Dr. Garwin Brothers to attend this primary C/S at 39 weeks due to low-lying placenta. The mother is a G2P0A1 O pos, GBS neg with gestational thrombocytopenia and Factor 5 Leiden deficiency. ROM at delivery, fluid clear. CAN times 2 loosely. Infant vigorous with good spontaneous cry and tone. Needed only minimal bulb suctioning. Ap 9/10. Lungs clear to ausc in DR. To CN to care of Pediatrician.   Real Cons, MD

## 2014-12-17 NOTE — Anesthesia Postprocedure Evaluation (Signed)
Anesthesia Post Note  Patient: Alicia Lloyd  Procedure(s) Performed: Procedure(s) (LRB): Primary CESAREAN SECTION (N/A)  Anesthesia type: Spinal  Patient location: PACU  Post pain: Pain level controlled  Post assessment: Post-op Vital signs reviewed  Last Vitals:  Filed Vitals:   12/17/14 1300  BP:   Pulse: 84  Temp: 36.4 C  Resp: 23    Post vital signs: Reviewed  Level of consciousness: awake  Complications: No apparent anesthesia complications

## 2014-12-18 ENCOUNTER — Encounter (HOSPITAL_COMMUNITY): Payer: Self-pay | Admitting: Obstetrics and Gynecology

## 2014-12-18 LAB — BIRTH TISSUE RECOVERY COLLECTION (PLACENTA DONATION)

## 2014-12-18 LAB — RPR: RPR Ser Ql: NONREACTIVE

## 2014-12-18 LAB — CBC
HCT: 35.9 % — ABNORMAL LOW (ref 36.0–46.0)
Hemoglobin: 12.4 g/dL (ref 12.0–15.0)
MCH: 32.7 pg (ref 26.0–34.0)
MCHC: 34.5 g/dL (ref 30.0–36.0)
MCV: 94.7 fL (ref 78.0–100.0)
Platelets: 138 10*3/uL — ABNORMAL LOW (ref 150–400)
RBC: 3.79 MIL/uL — ABNORMAL LOW (ref 3.87–5.11)
RDW: 13 % (ref 11.5–15.5)
WBC: 13.8 10*3/uL — AB (ref 4.0–10.5)

## 2014-12-18 NOTE — Op Note (Signed)
Alicia Lloyd, Alicia Lloyd NO.:  [id number]  MEDICAL RECORD NO.:  [id number]  LOCATION:  5035                          FACILITY:  [facility]  PHYSICIAN:  Servando Salina, M.D.DATE OF BIRTH:  1986-10-11  DATE OF PROCEDURE:  12/17/2014 DATE OF DISCHARGE:                              OPERATIVE REPORT   PREOPERATIVE DIAGNOSES:  Low-lying/marginal placenta, term gestation.  PROCEDURES: Primary cesarean section, Kerr hysterotomy.  POSTOPERATIVE DIAGNOSES:  Marginal placenta, term gestation.  ANESTHESIA:  Spinal.  SURGEON:  Servando Salina, M.D.  ASSISTANT:  Laury Deep, CNM  DESCRIPTION OF PROCEDURE:  Under adequate spinal anesthesia, the patient was placed in the supine position with a left lateral tilt.  She was sterilely prepped and draped in usual fashion and an indwelling Foley catheter was sterilely placed.  0.25% Marcaine was injected along the planned Pfannenstiel skin incision site.  Pfannenstiel skin incision was then made, carried down to the rectus fascia.  The rectus fascia was opened transversely.  The rectus fascia was then bluntly and sharply dissected off the rectus muscle in a superior and inferior fashion.  The rectus muscle was split in midline.  The parietal peritoneum was entered bluntly and extended.  A developed lower uterine segment was noted.  The vesicouterine peritoneum was opened transversely.  The bladder was bluntly dissected off the lower uterine segment and displaced inferiorly using a bladder retractor.  A curvilinear low-transverse uterine incision was then made and extended bilaterally.  Clear amniotic fluid was noted at the time of rupture of membranes.  Presenting part of vertex, live female, position in the right occiput transverse position with a cord around the neck x2 was noted both of which were reducible. Baby was delivered, bulb suctioned in the abdomen.  Cord was clamped, cut.  The baby was transferred to the awaiting  pediatrician who assigned Apgars of 9 and 10 at one and five minutes respectively.  The placenta on inspection of the uterine cavity was noted to be just at the edge of the junction of the body of the uterus and the cervix.  The placenta was spontaneously delivered intact, not sent to Pathology.  Uterine cavity was cleaned of debris.  Uterine incision had no extension.  It was closed in 2 layers.  The first layer was 0-Monocryl running locked stitch, second layer was imbricated using 0-Monocryl suture.  Abdomen was then copiously irrigated and suctioned.  Normal tubes and ovaries were noted bilaterally.  The right tube had a paratubal cyst, which was not removed.  The abdomen was copiously irrigated and suctioned. Paracolic gutters were cleaned of debris.  Interceed was placed over the lower uterine segment in an inverted T fashion.  The parietal peritoneum was closed with 2-0 Vicryl.  The rectus fascia was then closed with 0- Vicryl x2.  The subcutaneous area was irrigated, small bleeders cauterized.  Interrupted 2-0 plain sutures placed and the skin approximated with 4-0 Vicryl subcuticular sutures and Steri-Strips subsequently placed.  SPECIMENS:  Placenta not sent to Pathology.  ESTIMATED BLOOD LOSS:  700 mL.  INTRAOPERATIVE FLUID:  2400 mL.  URINE OUTPUT:  300 mL.  COUNTS:  Sponge and instrument counts x2 was correct.  COMPLICATIONS:  None.  The patient was transferred to recovery room in stable condition. Baby placed skin to skin.     Servando Salina, M.D.     [place]/[facility]  D:  12/17/2014  T:  12/18/2014  Job:  [id number]

## 2014-12-18 NOTE — Lactation Note (Signed)
This note was copied from the chart of [name]. Lactation Consultation Note  Patient Name: Alicia Lloyd SMOLM'B Date: 12/18/2014 Reason for consult: Follow-up assessment  Baby 23 hours of life. Mom reports everything with BF going well and she does not need assistance. Enc mom to call out for assistance with BF as needed.  Maternal Data    Feeding Feeding Type: Breast Milk Length of feed: 15 min  LATCH Score/Interventions                      Lactation Tools Discussed/Used     Consult Status Consult Status: PRN    Inocente Salles 12/18/2014, 11:26 AM

## 2014-12-18 NOTE — Progress Notes (Signed)
POD # 1  Subjective: Pt reports feeling well/ Pain controlled with Motrin and Percocet Tolerating po/ Foley d/c'd and voiding without problems/ No n/v/ Flatus present Activity: ad lib Bleeding is light Newborn info:  Information for the patient's newborn:  Mohini, Heathcock [[id number]]  female   Circumcision: planning/ Feeding: breast   Objective:  VS:  Filed Vitals:   12/17/14 2215 12/18/14 0200 12/18/14 0215 12/18/14 0530  BP: 77/40 88/50 99/45  100/45  Pulse: 92 85 81 76  Temp:  98.5 F (36.9 C)  98.4 F (36.9 C)  TempSrc:      Resp: 22 18 20 18   Height:      Weight:      SpO2:  96% 95% 95%     I&O: Intake/Output      01/25 0701 - 01/26 0700 01/26 0701 - 01/27 0700   P.O. 1580    I.V. (mL/kg) 2475 (35)    Total Intake(mL/kg) 4055 (57.3)    Urine (mL/kg/hr) 3725    Blood 700    Total Output 4425     Net -370             Recent Labs  12/17/14 0910 12/18/14 0530  WBC 12.3* 13.8*  HGB 13.5 12.4  HCT 38.7 35.9*  PLT 134* 138*    Blood type: --/--/O POS, O POS (01/25 0910) Rubella: Immune (07/02 0000)    Physical Exam:  General: alert and cooperative CV: Regular rate and rhythm Resp: CTA bilaterally Abdomen: soft, nontender, normal bowel sounds Incision: healing well, no erythema, no hernia, no seroma, no swelling, well approximated with suture; moderate drk red drainage to left lateral honeycomb-outlined. Uterine Fundus: firm, below umbilicus, nontender Lochia: minimal Ext: extremities normal, atraumatic, no cyanosis or edema and Homans sign is negative, no sign of DVT, TEDs on    Assessment: POD # 1/ G2P1011/ S/P C/Section d/t low-lying placenta  Gestational thrombocytopenia, delivered-stable Doing well  Plan: Ambulate Continue routine post op orders   Signed: Julianne Handler, N, MSN, CNM 12/18/2014, 9:30 AM

## 2014-12-19 MED ORDER — OXYCODONE-ACETAMINOPHEN 5-325 MG PO TABS: 1.0000 | ORAL_TABLET | ORAL | Status: DC | PRN

## 2014-12-19 MED ORDER — FERROUS SULFATE 325 (65 FE) MG PO TABS: 325.0000 mg | ORAL_TABLET | Freq: Two times a day (BID) | ORAL | Status: DC

## 2014-12-19 MED ORDER — IBUPROFEN 600 MG PO TABS: 600.0000 mg | ORAL_TABLET | Freq: Four times a day (QID) | ORAL | Status: DC

## 2014-12-19 NOTE — Discharge Summary (Signed)
POSTOPERATIVE DISCHARGE SUMMARY:  Patient ID: Alicia Lloyd MRN: [id number] DOB/AGE: [birth year] 29 y.o.  Admit date: 12/17/2014 Admission Diagnoses: Low Lying Marginal Placenta, Gestational Thrombocytopenia, History of 3rd trimester bleeding, term gestation    Discharge date:   Discharge Diagnoses: S/P Primary C/S due to Marginal Placenta, Gestational Thrombocytopenia, History of 3rd trimester bleeding, term gestation on 12/17/2014        Prenatal history: G2P1011   EDC : 12/24/2014, LMP and Ultrasound Has received prenatal care at [facility] since 9.[redacted] wks gestation. Primary provider : Dr. Benjie Karvonen / Dr. Garwin Brothers Prenatal course complicated by Low Lying Marginal Placenta, Gestational Thrombocytopenia, History of 3rd trimester bleeding, term gestation  Prenatal Labs: ABO, Rh:  O POS (01/25 0910)  Antibody: NEG (01/25 0910) Rubella: Immune (07/02 0000)   RPR: Non Reactive (01/25 0910)  HBsAg:  Non-Reactive HIV: Non-reactive (07/02 0000)  GTT : Normal - 118 GBS:  Negative   Medical / Surgical History :  Past medical history:  Past Medical History  Diagnosis Date  . Vaginal Pap smear, abnormal   . Postpartum care following cesarean delivery (1/25) 12/17/2014  . H/O LEEP   . History of HPV infection   . Thrombocythemia     Past surgical history:  Past Surgical History  Procedure Laterality Date  . Leep  2008  . Wisdom tooth extraction    . Dilation and curettage of uterus  2008    TAB  . Cesarean section N/A 12/17/2014    Procedure: Primary CESAREAN SECTION;  Surgeon: Marvene Staff, MD;  Location: [facility];  Service: Obstetrics;  Laterality: N/A;  EDD: 12/24/14     Allergies: Review of patient's allergies indicates no known allergies.   Intrapartum Course:  Admitted for scheduled cesarean delivery d/t Low Lying Marginal Placenta, Gestational Thrombocytopenia, History of 3rd trimester bleeding, term gestation / cesarean delivery of viable female by Dr.  Garwin Brothers - see operative report for details  Physical Exam:   VSS: Blood pressure 111/65, pulse 63, temperature 97.7 F (36.5 C), temperature source Oral, resp. rate 18, height 5\' 6"  (1.676 m), weight 70.761 kg (156 lb), SpO2 95 %, currently breastfeeding.  LABS:  Recent Labs  12/17/14 0910 12/18/14 0530  WBC 12.3* 13.8*  HGB 13.5 12.4  PLT 134* 138*    Newborn Data Live born female on 12/17/2014 Birth Weight: 7 lb 4.6 oz (3305 g) APGAR: 9, 10  See operative report for further details  Home with mother.  Discharge Instructions:  Wound Care: keep clean and dry / remove honeycomb POD 7 Postpartum Instructions: [facility] discharge booklet - instructions reviewed Medications:    Medication List    STOP taking these medications        progesterone 200 MG capsule  Commonly known as:  PROMETRIUM      TAKE these medications        calcium carbonate 500 MG chewable tablet  Commonly known as:  TUMS - dosed in mg elemental calcium  Chew 2 tablets by mouth daily as needed for indigestion or heartburn.     DHA PO  Take 1 tablet by mouth daily.     ferrous sulfate 325 (65 FE) MG tablet  Take 1 tablet (325 mg total) by mouth 2 (two) times daily with a meal.     ibuprofen 600 MG tablet  Commonly known as:  ADVIL,MOTRIN  Take 1 tablet (600 mg total) by mouth every 6 (six) hours.     magnesium gluconate 500 MG tablet  Commonly known as:  MAGONATE  Take 500 mg by mouth daily at 10 pm.     oxyCODONE-acetaminophen 5-325 MG per tablet  Commonly known as:  PERCOCET/ROXICET  Take 1 tablet by mouth every 4 (four) hours as needed (for pain scale less than 7).     prenatal multivitamin Tabs tablet  Take 1 tablet by mouth daily at 12 noon.           Follow-up Information    Follow up with COUSINS,SHERONETTE A, MD. Schedule an appointment as soon as possible for a visit in 6 weeks.   Specialty:  Obstetrics and Gynecology   Why:  postpartum visit   Contact information:   [address] [place] [address] [phone]         Signed: Graceann Congress, MSN, CNM 12/19/2014, 11:35 AM

## 2014-12-19 NOTE — Lactation Note (Signed)
This note was copied from the chart of [place]. Lactation Consultation Note  Patient Name: Alicia Lloyd AVWUJ'W Date: 12/19/2014 Reason for consult: Follow-up assessment;Breast/nipple pain Mom reports BF is going well. She has some nipple tenderness and is using nipple cream. Comfort gels given with instructions. Mom reports milk is coming in. Declines any assist from [facility]. Mom reports she is [employer] and has doula that will assist her if needed.   Maternal Data    Feeding Feeding Type: Breast Fed Length of feed: 30 min  LATCH Score/Interventions                      Lactation Tools Discussed/Used Tools: Comfort gels   Consult Status Consult Status: Complete Date: 12/19/14 Follow-up type: In-patient    Katrine Coho 12/19/2014, 8:34 AM

## 2014-12-19 NOTE — Discharge Instructions (Signed)
Breast Pumping Tips °If you are breastfeeding, there may be times when you cannot feed your baby directly. Returning to work or going on a trip are common examples. Pumping allows you to store breast milk and feed it to your baby later.  °You may not get much milk when you first start to pump. Your breasts should start to make more after a few days. If you pump at the times you usually feed your baby, you may be able to keep making enough milk to feed your baby without also using formula. The more often you pump, the more milk you will produce.  °WHEN SHOULD I PUMP?  °· You can begin to pump soon after delivery. However, some experts recommend waiting about 4 weeks before giving your infant a bottle to make sure breastfeeding is going well.  °· If you plan to return to work, begin pumping a few weeks before. This will help you develop techniques that work best for you. It also lets you build up a supply of breast milk.   °· When you are with your infant, feed on demand and pump after each feeding.   °· When you are away from your infant for several hours, pump for about 15 minutes every 2-3 hours. Pump both breasts at the same time if you can.   °· If your infant has a formula feeding, make sure to pump around the same time.     °· If you drink any alcohol, wait 2 hours before pumping.   °HOW DO I PREPARE TO PUMP? °Your let-down reflex is the natural reaction to stimulation that makes your breast milk flow. It is easier to stimulate this reflex when you are relaxed. Find relaxation techniques that work for you. If you have difficulty with your let-down reflex, try these methods:  °· Smell one of your infant's blankets or an item of clothing.   °· Look at a picture or video of your infant.   °· Sit in a quiet, private space.   °· Massage the breast you plan to pump.   °· Place soothing warmth on the breast.   °· Play relaxing music.   °WHAT ARE SOME GENERAL BREAST PUMPING TIPS? °· Wash your hands before you pump. You  do not need to wash your nipples or breasts. °· There are three ways to pump. °¨ You can use your hand to massage and compress your breast. °¨ You can use a handheld manual pump. °¨ You can use an electric pump.   °· Make sure the suction cup (flange) on the breast pump is the right size. Place the flange directly over the nipple. If it is the wrong size or placed the wrong way, it may be painful and cause nipple damage.   °· If pumping is uncomfortable, apply a small amount of purified or modified lanolin to your nipple and areola. °· If you are using an electric pump, adjust the speed and suction power to be more comfortable. °· If pumping is painful or if you are not getting very much milk, you may need a different type of pump. A lactation consultant can help you determine what type of pump to use.   °· Keep a full water bottle near you at all times. Drinking lots of fluid helps you make more milk.  °· You can store your milk to use later. Pumped breast milk can be stored in a sealable, sterile container or plastic bag. Label all stored breast milk with the date you pumped it. °¨ Milk can stay out at room temperature for up to 8 hours. °¨   You can store your milk in the refrigerator for up to 8 days. °¨ You can store your milk in the freezer for 3 months. Thaw frozen milk using warm water. Do not put it in the microwave. °· Do not smoke. Smoking can lower your milk supply and harm your infant. If you need help quitting, ask your health care provider to recommend a program.   °WHEN SHOULD I CALL MY HEALTH CARE PROVIDER OR A LACTATION CONSULTANT? °· You are having trouble pumping. °· You are concerned that you are not making enough milk. °· You have nipple pain, soreness, or redness. °· You want to use birth control. Birth control pills may lower your milk supply. Talk to your health care provider about your options. °Document Released: 04/29/2010 Document Revised: 11/14/2013 Document Reviewed:  09/01/2013 °[employer]® Patient Information ©2015 [employer]. This information is not intended to replace advice given to you by your health care provider. Make sure you discuss any questions you have with your health care provider. ° °Nutrition for the New Mother  °A new mother needs good health and nutrition so she can have energy to take care of a new baby. Whether a mother breastfeeds or formula feeds the baby, it is important to have a well-balanced diet. Foods from all the food groups should be chosen to meet the new mother's energy needs and to give her the nutrients needed for repair and healing.  °A HEALTHY EATING PLAN °The My Pyramid plan for Moms outlines what you should eat to help you and your baby stay healthy. The energy and amount of food you need depends on whether or not you are breastfeeding. If you are breastfeeding you will need more nutrients. If you choose not to breastfeed, your nutrition goal should be to return to a healthy weight. Limiting calories may be needed if you are not breastfeeding.  °HOME CARE INSTRUCTIONS  °· For a personal plan based on your unique needs, see your Registered Dietitian or visit www.mypyramid.gov. °· Eat a variety of foods. The plan below will help guide you. The following chart has a suggested daily meal plan from the My Pyramid for Moms. °· Eat a variety of fruits and vegetables. °· Eat more dark green and orange vegetables and cooked dried beans. °· Make half your grains whole grains. Choose whole instead of refined grains. °· Choose low-fat or lean meats and poultry. °· Choose low-fat or fat-free dairy products like milk, cheese, or yogurt. °Fruits °· Breastfeeding: 2 cups °· Non-Breastfeeding: 2 cups °· What Counts as a serving? °¨ 1 cup of fruit or juice. °¨ ½ cup dried fruit. °Vegetables °· Breastfeeding: 3 cups °· Non-Breastfeeding: 2 ½ cups °· What Counts as a serving? °¨ 1 cup raw or cooked vegetables. °¨ Juice or 2 cups raw leafy  vegetables. °Grains °· Breastfeeding: 8 oz °· Non-Breastfeeding: 6 oz °· What Counts as a serving? °¨ 1 slice bread. °¨ 1 oz ready-to-eat cereal. °¨ ½ cup cooked pasta, rice, or cereal. °Meat and Beans °· Breastfeeding: 6 ½ oz °· Non-Breastfeeding: 5 ½ oz °· What Counts as a serving? °¨ 1 oz lean meat, poultry, or fish °¨ ¼ cup cooked dry beans °¨ ½ oz nuts or 1 egg °¨ 1 tbs peanut butter °Milk °· Breastfeeding: 3 cups °· Non-Breastfeeding: 3 cups °· What Counts as a serving? °¨ 1 cup milk. °¨ 8 oz yogurt. °¨ 1 ½ oz cheese. °¨ 2 oz processed cheese. °TIPS FOR THE BREASTFEEDING MOM °· Rapid weight   loss is not suggested when you are breastfeeding. By simply breastfeeding, you will be able to lose the weight gained during your pregnancy. Your caregiver can keep track of your weight and tell you if your weight loss is appropriate.  Be sure to drink fluids. You may notice that you are thirstier than usual. A suggestion is to drink a glass of water or other beverage whenever you breastfeed.  Avoid alcohol as it can be passed into your breast milk.  Limit caffeine drinks to no more than 2 to 3 cups per day.  You may need to keep taking your prenatal vitamin while you are breastfeeding. Talk with your caregiver about taking a vitamin or supplement. RETURING TO A HEALTHY WEIGHT  The My Pyramid Plan for Moms will help you return to a healthy weight. It will also provide the nutrients you need.  You may need to limit "empty" calories. These include:  High fat foods like fried foods, fatty meats, fast food, butter, and mayonnaise.  High sugar foods like sodas, jelly, candy, and sweets.  Be physically active. Include 30 minutes of exercise or more each day. Choose an activity you like such as walking, swimming, biking, or aerobics. Check with your caregiver before you start to exercise. Document Released: 02/16/2008 Document Revised: 02/01/2012 Document Reviewed: 02/16/2008 [facility] Patient Information  2015 [place], [place]. This information is not intended to replace advice given to you by your health care provider. Make sure you discuss any questions you have with your health care provider. Postpartum Depression and Baby Blues The postpartum period begins right after the birth of a baby. During this time, there is often a great amount of joy and excitement. It is also a time of many changes in the life of the parents. Regardless of how many times a mother gives birth, each child brings new challenges and dynamics to the family. It is not unusual to have feelings of excitement along with confusing shifts in moods, emotions, and thoughts. All mothers are at risk of developing postpartum depression or the "baby blues." These mood changes can occur right after giving birth, or they may occur many months after giving birth. The baby blues or postpartum depression can be mild or severe. Additionally, postpartum depression can go away rather quickly, or it can be a long-term condition.  CAUSES Raised hormone levels and the rapid drop in those levels are thought to be a main cause of postpartum depression and the baby blues. A number of hormones change during and after pregnancy. Estrogen and progesterone usually decrease right after the delivery of your baby. The levels of thyroid hormone and various cortisol steroids also rapidly drop. Other factors that play a role in these mood changes include major life events and genetics.  RISK FACTORS If you have any of the following risks for the baby blues or postpartum depression, know what symptoms to watch out for during the postpartum period. Risk factors that may increase the likelihood of getting the baby blues or postpartum depression include:  Having a personal or family history of depression.   Having depression while being pregnant.   Having premenstrual mood issues or mood issues related to oral contraceptives.  Having a lot of life stress.   Having  marital conflict.   Lacking a social support network.   Having a baby with special needs.   Having health problems, such as diabetes.  SIGNS AND SYMPTOMS Symptoms of baby blues include:  Brief changes in mood, such as going  from extreme happiness to sadness.  Decreased concentration.   Difficulty sleeping.   Crying spells, tearfulness.   Irritability.   Anxiety.  Symptoms of postpartum depression typically begin within the first month after giving birth. These symptoms include:  Difficulty sleeping or excessive sleepiness.   Marked weight loss.   Agitation.   Feelings of worthlessness.   Lack of interest in activity or food.  Postpartum psychosis is a very serious condition and can be dangerous. Fortunately, it is rare. Displaying any of the following symptoms is cause for immediate medical attention. Symptoms of postpartum psychosis include:   Hallucinations and delusions.   Bizarre or disorganized behavior.   Confusion or disorientation.  DIAGNOSIS  A diagnosis is made by an evaluation of your symptoms. There are no medical or lab tests that lead to a diagnosis, but there are various questionnaires that a health care provider may use to identify those with the baby blues, postpartum depression, or psychosis. Often, a screening tool called the [place] Postnatal Depression Scale is used to diagnose depression in the postpartum period.  TREATMENT The baby blues usually goes away on its own in 1-2 weeks. Social support is often all that is needed. You will be encouraged to get adequate sleep and rest. Occasionally, you may be given medicines to help you sleep.  Postpartum depression requires treatment because it can last several months or longer if it is not treated. Treatment may include individual or group therapy, medicine, or both to address any social, physiological, and psychological factors that may play a role in the depression. Regular exercise, a  healthy diet, rest, and social support may also be strongly recommended.  Postpartum psychosis is more serious and needs treatment right away. Hospitalization is often needed. HOME CARE INSTRUCTIONS  Get as much rest as you can. Nap when the baby sleeps.   Exercise regularly. Some women find yoga and walking to be beneficial.   Eat a balanced and nourishing diet.   Do little things that you enjoy. Have a cup of tea, take a bubble bath, read your favorite magazine, or listen to your favorite music.  Avoid alcohol.   Ask for help with household chores, cooking, grocery shopping, or running errands as needed. Do not try to do everything.   Talk to people close to you about how you are feeling. Get support from your partner, family members, friends, or other new moms.  Try to stay positive in how you think. Think about the things you are grateful for.   Do not spend a lot of time alone.   Only take over-the-counter or prescription medicine as directed by your health care provider.  Keep all your postpartum appointments.   Let your health care provider know if you have any concerns.  SEEK MEDICAL CARE IF: You are having a reaction to or problems with your medicine. SEEK IMMEDIATE MEDICAL CARE IF:  You have suicidal feelings.   You think you may harm the baby or someone else. MAKE SURE YOU:  Understand these instructions.  Will watch your condition.  Will get help right away if you are not doing well or get worse. Document Released: 08/13/2004 Document Revised: 11/14/2013 Document Reviewed: 08/21/2013 [facility] Patient Information 2015 [place], [place]. This information is not intended to replace advice given to you by your health care provider. Make sure you discuss any questions you have with your health care provider. Breastfeeding and Mastitis Mastitis is inflammation of the breast tissue. It can occur in women who  are breastfeeding. This can make breastfeeding  painful. Mastitis will sometimes go away on its own. Your health care provider will help determine if treatment is needed. CAUSES Mastitis is often associated with a blocked milk (lactiferous) duct. This can happen when too much milk builds up in the breast. Causes of excess milk in the breast can include:  Poor latch-on. If your baby is not latched onto the breast properly, she or he may not empty your breast completely while breastfeeding.  Allowing too much time to pass between feedings.  Wearing a bra or other clothing that is too tight. This puts extra pressure on the lactiferous ducts so milk does not flow through them as it should. Mastitis can also be caused by a bacterial infection. Bacteria may enter the breast tissue through cuts or openings in the skin. In women who are breastfeeding, this may occur because of cracked or irritated skin. Cracks in the skin are often caused when your baby does not latch on properly to the breast. SIGNS AND SYMPTOMS  Swelling, redness, tenderness, and pain in an area of the breast.  Swelling of the glands under the arm on the same side.  Fever may or may not accompany mastitis. If an infection is allowed to progress, a collection of pus (abscess) may develop. DIAGNOSIS  Your health care provider can usually diagnose mastitis based on your symptoms and a physical exam. Tests may be done to help confirm the diagnosis. These may include:  Removal of pus from the breast by applying pressure to the area. This pus can be examined in the lab to determine which bacteria are present. If an abscess has developed, the fluid in the abscess can be removed with a needle. This can also be used to confirm the diagnosis and determine the bacteria present. In most cases, pus will not be present.  Blood tests to determine if your body is fighting a bacterial infection.  Mammogram or ultrasound tests to rule out other problems or diseases. TREATMENT  Mastitis that  occurs with breastfeeding will sometimes go away on its own. Your health care provider may choose to wait 24 hours after first seeing you to decide whether a prescription medicine is needed. If your symptoms are worse after 24 hours, your health care provider will likely prescribe an antibiotic medicine to treat the mastitis. He or she will determine which bacteria are most likely causing the infection and will then select an appropriate antibiotic medicine. This is sometimes changed based on the results of tests performed to identify the bacteria, or if there is no response to the antibiotic medicine selected. Antibiotic medicines are usually given by mouth. You may also be given medicine for pain. HOME CARE INSTRUCTIONS  Only take over-the-counter or prescription medicines for pain, fever, or discomfort as directed by your health care provider.  If your health care provider prescribed an antibiotic medicine, take the medicine as directed. Make sure you finish it even if you start to feel better.  Do not wear a tight or underwire bra. Wear a soft, supportive bra.  Increase your fluid intake, especially if you have a fever.  Continue to empty the breast. Your health care provider can tell you whether this milk is safe for your infant or needs to be thrown out. You may be told to stop nursing until your health care provider thinks it is safe for your baby. Use a breast pump if you are advised to stop nursing.  Keep your nipples  clean and dry.  Empty the first breast completely before going to the other breast. If your baby is not emptying your breasts completely for some reason, use a breast pump to empty your breasts.  If you go back to work, pump your breasts while at work to stay in time with your nursing schedule.  Avoid allowing your breasts to become overly filled with milk (engorged). SEEK MEDICAL CARE IF:  You have pus-like discharge from the breast.  Your symptoms do not improve with  the treatment prescribed by your health care provider within 2 days. SEEK IMMEDIATE MEDICAL CARE IF:  Your pain and swelling are getting worse.  You have pain that is not controlled with medicine.  You have a red line extending from the breast toward your armpit.  You have a fever or persistent symptoms for more than 2-3 days.  You have a fever and your symptoms suddenly get worse. MAKE SURE YOU:   Understand these instructions.  Will watch your condition.  Will get help right away if you are not doing well or get worse. Document Released: 03/06/2005 Document Revised: 11/14/2013 Document Reviewed: 06/15/2013 [facility] Patient Information 2015 [place], [place]. This information is not intended to replace advice given to you by your health care provider. Make sure you discuss any questions you have with your health care provider. Breastfeeding Deciding to breastfeed is one of the best choices you can make for you and your baby. A change in hormones during pregnancy causes your breast tissue to grow and increases the number and size of your milk ducts. These hormones also allow proteins, sugars, and fats from your blood supply to make breast milk in your milk-producing glands. Hormones prevent breast milk from being released before your baby is born as well as prompt milk flow after birth. Once breastfeeding has begun, thoughts of your baby, as well as his or her sucking or crying, can stimulate the release of milk from your milk-producing glands.  BENEFITS OF BREASTFEEDING For Your Baby  Your first milk (colostrum) helps your baby's digestive system function better.   There are antibodies in your milk that help your baby fight off infections.   Your baby has a lower incidence of asthma, allergies, and sudden infant death syndrome.   The nutrients in breast milk are better for your baby than infant formulas and are designed uniquely for your baby's needs.   Breast milk improves your  baby's brain development.   Your baby is less likely to develop other conditions, such as childhood obesity, asthma, or type 2 diabetes mellitus.  For You   Breastfeeding helps to create a very special bond between you and your baby.   Breastfeeding is convenient. Breast milk is always available at the correct temperature and costs nothing.   Breastfeeding helps to burn calories and helps you lose the weight gained during pregnancy.   Breastfeeding makes your uterus contract to its prepregnancy size faster and slows bleeding (lochia) after you give birth.   Breastfeeding helps to lower your risk of developing type 2 diabetes mellitus, osteoporosis, and breast or ovarian cancer later in life. SIGNS THAT YOUR BABY IS HUNGRY Early Signs of Hunger  Increased alertness or activity.  Stretching.  Movement of the head from side to side.  Movement of the head and opening of the mouth when the corner of the mouth or cheek is stroked (rooting).  Increased sucking sounds, smacking lips, cooing, sighing, or squeaking.  Hand-to-mouth movements.  Increased sucking of  fingers or hands. Late Signs of Hunger  Fussing.  Intermittent crying. Extreme Signs of Hunger Signs of extreme hunger will require calming and consoling before your baby will be able to breastfeed successfully. Do not wait for the following signs of extreme hunger to occur before you initiate breastfeeding:   Restlessness.  A loud, strong cry.   Screaming. BREASTFEEDING BASICS Breastfeeding Initiation  Find a comfortable place to sit or lie down, with your neck and back well supported.  Place a pillow or rolled up blanket under your baby to bring him or her to the level of your breast (if you are seated). Nursing pillows are specially designed to help support your arms and your baby while you breastfeed.  Make sure that your baby's abdomen is facing your abdomen.   Gently massage your breast. With your  fingertips, massage from your chest wall toward your nipple in a circular motion. This encourages milk flow. You may need to continue this action during the feeding if your milk flows slowly.  Support your breast with 4 fingers underneath and your thumb above your nipple. Make sure your fingers are well away from your nipple and your baby's mouth.   Stroke your baby's lips gently with your finger or nipple.   When your baby's mouth is open wide enough, quickly bring your baby to your breast, placing your entire nipple and as much of the colored area around your nipple (areola) as possible into your baby's mouth.   More areola should be visible above your baby's upper lip than below the lower lip.   Your baby's tongue should be between his or her lower gum and your breast.   Ensure that your baby's mouth is correctly positioned around your nipple (latched). Your baby's lips should create a seal on your breast and be turned out (everted).  It is common for your baby to suck about 2-3 minutes in order to start the flow of breast milk. Latching Teaching your baby how to latch on to your breast properly is very important. An improper latch can cause nipple pain and decreased milk supply for you and poor weight gain in your baby. Also, if your baby is not latched onto your nipple properly, he or she may swallow some air during feeding. This can make your baby fussy. Burping your baby when you switch breasts during the feeding can help to get rid of the air. However, teaching your baby to latch on properly is still the best way to prevent fussiness from swallowing air while breastfeeding. Signs that your baby has successfully latched on to your nipple:    Silent tugging or silent sucking, without causing you pain.   Swallowing heard between every 3-4 sucks.    Muscle movement above and in front of his or her ears while sucking.  Signs that your baby has not successfully latched on to  nipple:   Sucking sounds or smacking sounds from your baby while breastfeeding.  Nipple pain. If you think your baby has not latched on correctly, slip your finger into the corner of your baby's mouth to break the suction and place it between your baby's gums. Attempt breastfeeding initiation again. Signs of Successful Breastfeeding Signs from your baby:   A gradual decrease in the number of sucks or complete cessation of sucking.   Falling asleep.   Relaxation of his or her body.   Retention of a small amount of milk in his or her mouth.   Letting go  of your breast by himself or herself. Signs from you:  Breasts that have increased in firmness, weight, and size 1-3 hours after feeding.   Breasts that are softer immediately after breastfeeding.  Increased milk volume, as well as a change in milk consistency and color by the fifth day of breastfeeding.   Nipples that are not sore, cracked, or bleeding. Signs That Your [employer]el Books is Getting Enough Milk  Wetting at least 3 diapers in a 24-hour period. The urine should be clear and pale yellow by age 29 days.  At least 3 stools in a 24-hour period by age 29 days. The stool should be soft and yellow.  At least 3 stools in a 24-hour period by age 666 days. The stool should be seedy and yellow.  No loss of weight greater than 10% of birth weight during the first 33 days of age.  Average weight gain of 4-7 ounces (113-198 g) per week after age 66 days.  Consistent daily weight gain by age 34 days, without weight loss after the age of 2 weeks. After a feeding, your baby may spit up a small amount. This is common. BREASTFEEDING FREQUENCY AND DURATION Frequent feeding will help you make more milk and can prevent sore nipples and breast engorgement. Breastfeed when you feel the need to reduce the fullness of your breasts or when your baby shows signs of hunger. This is called "breastfeeding on demand." Avoid introducing a pacifier to your  baby while you are working to establish breastfeeding (the first 4-6 weeks after your baby is born). After this time you may choose to use a pacifier. Research has shown that pacifier use during the first year of a baby's life decreases the risk of sudden infant death syndrome (SIDS). Allow your baby to feed on each breast as long as he or she wants. Breastfeed until your baby is finished feeding. When your baby unlatches or falls asleep while feeding from the first breast, offer the second breast. Because newborns are often sleepy in the first few weeks of life, you may need to awaken your baby to get him or her to feed. Breastfeeding times will vary from baby to baby. However, the following rules can serve as a guide to help you ensure that your baby is properly fed:  Newborns (babies 26 weeks of age or younger) may breastfeed every 1-3 hours.  Newborns should not go longer than 3 hours during the day or 5 hours during the night without breastfeeding.  You should breastfeed your baby a minimum of 8 times in a 24-hour period until you begin to introduce solid foods to your baby at around 46 months of age. BREAST MILK PUMPING Pumping and storing breast milk allows you to ensure that your baby is exclusively fed your breast milk, even at times when you are unable to breastfeed. This is especially important if you are going back to work while you are still breastfeeding or when you are not able to be present during feedings. Your lactation consultant can give you guidelines on how long it is safe to store breast milk.  A breast pump is a machine that allows you to pump milk from your breast into a sterile bottle. The pumped breast milk can then be stored in a refrigerator or freezer. Some breast pumps are operated by hand, while others use electricity. Ask your lactation consultant which type will work best for you. Breast pumps can be purchased, but some hospitals and breastfeeding support groups  lease  breast pumps on a monthly basis. A lactation consultant can teach you how to hand express breast milk, if you prefer not to use a pump.  CARING FOR YOUR BREASTS WHILE YOU BREASTFEED Nipples can become dry, cracked, and sore while breastfeeding. The following recommendations can help keep your breasts moisturized and healthy:  Avoid using soap on your nipples.   Wear a supportive bra. Although not required, special nursing bras and tank tops are designed to allow access to your breasts for breastfeeding without taking off your entire bra or top. Avoid wearing underwire-style bras or extremely tight bras.  Air dry your nipples for 3-19minutes after each feeding.   Use only cotton bra pads to absorb leaked breast milk. Leaking of breast milk between feedings is normal.   Use lanolin on your nipples after breastfeeding. Lanolin helps to maintain your skin's normal moisture barrier. If you use pure lanolin, you do not need to wash it off before feeding your baby again. Pure lanolin is not toxic to your baby. You may also hand express a few drops of breast milk and gently massage that milk into your nipples and allow the milk to air dry. In the first few weeks after giving birth, some women experience extremely full breasts (engorgement). Engorgement can make your breasts feel heavy, warm, and tender to the touch. Engorgement peaks within 3-5 days after you give birth. The following recommendations can help ease engorgement:  Completely empty your breasts while breastfeeding or pumping. You may want to start by applying warm, moist heat (in the shower or with warm water-soaked hand towels) just before feeding or pumping. This increases circulation and helps the milk flow. If your baby does not completely empty your breasts while breastfeeding, pump any extra milk after he or she is finished.  Wear a snug bra (nursing or regular) or tank top for 1-2 days to signal your body to slightly decrease milk  production.  Apply ice packs to your breasts, unless this is too uncomfortable for you.  Make sure that your baby is latched on and positioned properly while breastfeeding. If engorgement persists after 48 hours of following these recommendations, contact your health care provider or a Science writer. OVERALL HEALTH CARE RECOMMENDATIONS WHILE BREASTFEEDING  Eat healthy foods. Alternate between meals and snacks, eating 3 of each per day. Because what you eat affects your breast milk, some of the foods may make your baby more irritable than usual. Avoid eating these foods if you are sure that they are negatively affecting your baby.  Drink milk, fruit juice, and water to satisfy your thirst (about 10 glasses a day).   Rest often, relax, and continue to take your prenatal vitamins to prevent fatigue, stress, and anemia.  Continue breast self-awareness checks.  Avoid chewing and smoking tobacco.  Avoid alcohol and drug use. Some medicines that may be harmful to your baby can pass through breast milk. It is important to ask your health care provider before taking any medicine, including all over-the-counter and prescription medicine as well as vitamin and herbal supplements. It is possible to become pregnant while breastfeeding. If birth control is desired, ask your health care provider about options that will be safe for your baby. SEEK MEDICAL CARE IF:   You feel like you want to stop breastfeeding or have become frustrated with breastfeeding.  You have painful breasts or nipples.  Your nipples are cracked or bleeding.  Your breasts are red, tender, or warm.  You have  a swollen area on either breast.  You have a fever or chills.  You have nausea or vomiting.  You have drainage other than breast milk from your nipples.  Your breasts do not become full before feedings by the fifth day after you give birth.  You feel sad and depressed.  Your baby is too sleepy to eat  well.  Your baby is having trouble sleeping.   Your baby is wetting less than 3 diapers in a 24-hour period.  Your baby has less than 3 stools in a 24-hour period.  Your baby's skin or the white part of his or her eyes becomes yellow.   Your baby is not gaining weight by 71 days of age. SEEK IMMEDIATE MEDICAL CARE IF:   Your baby is overly tired (lethargic) and does not want to wake up and feed.  Your baby develops an unexplained fever. Document Released: 11/09/2005 Document Revised: 11/14/2013 Document Reviewed: 05/03/2013 [facility] Patient Information 2015 [place], [place]. This information is not intended to replace advice given to you by your health care provider. Make sure you discuss any questions you have with your health care provider.

## 2014-12-19 NOTE — Progress Notes (Signed)
POD # 2  Subjective: Pt reports feeling well/ Pain controlled with Motrin and Percocet Tolerating po/ Foley d/c'd and voiding without problems/ No n/v/ Flatus present. No BM. Activity: ad lib Bleeding is light Newborn info:  Information for the patient's newborn:  Alicia Lloyd, [place] [[id number]]  female    Circumcision: done / Feeding: breast   Objective:  VS:  Filed Vitals:   12/18/14 0215 12/18/14 0530 12/18/14 1745 12/19/14 0558  BP: 99/45 100/45 105/59 111/65  Pulse: 81 76 79 63  Temp:  98.4 F (36.9 C) 97.5 F (36.4 C) 97.7 F (36.5 C)  TempSrc:   Oral Oral  Resp: 20 18 18 18   Height:      Weight:      SpO2: 95% 95%       I&O: Intake/Output      01/26 0701 - 01/27 0700 01/27 0701 - 01/28 0700   P.O.     I.V. (mL/kg)     Total Intake(mL/kg)     Urine (mL/kg/hr) 650 (0.4)    Blood     Total Output 650     Net -650             Recent Labs  12/17/14 0910 12/18/14 0530  WBC 12.3* 13.8*  HGB 13.5 12.4  HCT 38.7 35.9*  PLT 134* 138*    Blood type: O POS (01/25 0910) Rubella: Immune (07/02 0000)    Physical Exam:  General: alert and cooperative CV: Regular rate and rhythm Resp: CTA bilaterally Abdomen: soft, nontender, normal bowel sounds Incision: healing well, no erythema, no hernia, no seroma, no swelling, well approximated with suture; moderate dried drk red drainage to left lateral honeycomb-outlined. Uterine Fundus: firm, 2 FB below umbilicus, nontender Lochia: minimal Ext: extremities normal, atraumatic, no cyanosis or edema and Homans sign is negative, no sign of DVT, TEDs on    Assessment: POD # 2 / [id number] / S/P C/Section d/t low-lying placenta  Gestational thrombocytopenia, delivered-stable Doing well  Plan: Ambulate Continue routine post op orders   Signed: Graceann Congress, MSN, CNM 12/19/2014, 11:30 AM

## 2019-10-27 ENCOUNTER — Telehealth: Payer: Self-pay

## 2019-10-27 NOTE — Telephone Encounter (Signed)
Copied from [place] [phone]. Topic: Appointment Scheduling - Scheduling Inquiry for Clinic >> Oct 27, 2019  2:08 PM Rayann Heman wrote: Reason for CRM: pt called and stated that her aunt Lin Givens is a pt of [facility] and would like to schedule a New pt with her. Please advise

## 2019-10-27 NOTE — Telephone Encounter (Signed)
Please advise 

## 2019-10-27 NOTE — Telephone Encounter (Signed)
Yes this is ok 

## 2019-10-30 NOTE — Telephone Encounter (Signed)
LM that is ok to schedule a new patient appointment with Fenton Malling.

## 2020-02-02 ENCOUNTER — Ambulatory Visit: Payer: Self-pay | Attending: Internal Medicine

## 2020-02-02 ENCOUNTER — Other Ambulatory Visit: Payer: Self-pay

## 2020-02-02 DIAGNOSIS — Z23 Encounter for immunization: Secondary | ICD-10-CM

## 2020-02-02 NOTE — Progress Notes (Signed)
   Covid-19 Vaccination Clinic  Name:  Alicia Lloyd    MRN: [id number] DOB: November 14, 1986  02/02/2020  Alicia Lloyd was observed post Covid-19 immunization for 15 minutes without incident. She was provided with Vaccine Information Sheet and instruction to access the V-Safe system.   Alicia Lloyd was instructed to call 911 with any severe reactions post vaccine: Marland Kitchen Difficulty breathing  . Swelling of face and throat  . A fast heartbeat  . A bad rash all over body  . Dizziness and weakness   Immunizations Administered    Name Date Dose VIS Date Route   Pfizer COVID-19 Vaccine 02/02/2020 10:39 AM 0.3 mL 11/03/2019 Intramuscular   Manufacturer: [place]   Lot: [id number]   [place]: [id number]

## 2020-02-28 ENCOUNTER — Ambulatory Visit: Payer: Self-pay | Attending: Internal Medicine

## 2020-02-28 DIAGNOSIS — Z23 Encounter for immunization: Secondary | ICD-10-CM

## 2020-02-28 NOTE — Progress Notes (Signed)
   Covid-19 Vaccination Clinic  Name:  Alicia Lloyd    MRN: [id number] DOB: 1986/05/21  02/28/2020  [name] was observed post Covid-19 immunization for 15 minutes without incident. She was provided with Vaccine Information Sheet and instruction to access the V-Safe system.   [name] was instructed to call 911 with any severe reactions post vaccine: Marland Kitchen Difficulty breathing  . Swelling of face and throat  . A fast heartbeat  . A bad rash all over body  . Dizziness and weakness   Immunizations Administered    Name Date Dose VIS Date Route   Pfizer COVID-19 Vaccine 02/28/2020  8:32 AM 0.3 mL 11/03/2019 Intramuscular   Manufacturer: [place]   Lot: [id number]   [place]: [id number]

## 2020-12-27 DIAGNOSIS — Z319 Encounter for procreative management, unspecified: Secondary | ICD-10-CM | POA: Diagnosis not present

## 2021-01-09 DIAGNOSIS — Z3689 Encounter for other specified antenatal screening: Secondary | ICD-10-CM | POA: Diagnosis not present

## 2021-01-09 DIAGNOSIS — Z3200 Encounter for pregnancy test, result unknown: Secondary | ICD-10-CM | POA: Diagnosis not present

## 2021-01-09 LAB — OB RESULTS CONSOLE ABO/RH: RH Type: POSITIVE

## 2021-02-03 DIAGNOSIS — Z3201 Encounter for pregnancy test, result positive: Secondary | ICD-10-CM | POA: Diagnosis not present

## 2021-02-18 DIAGNOSIS — Z118 Encounter for screening for other infectious and parasitic diseases: Secondary | ICD-10-CM | POA: Diagnosis not present

## 2021-02-18 DIAGNOSIS — O3441 Maternal care for other abnormalities of cervix, first trimester: Secondary | ICD-10-CM | POA: Diagnosis not present

## 2021-02-18 DIAGNOSIS — Z36 Encounter for antenatal screening for chromosomal anomalies: Secondary | ICD-10-CM | POA: Diagnosis not present

## 2021-02-18 DIAGNOSIS — O09819 Supervision of pregnancy resulting from assisted reproductive technology, unspecified trimester: Secondary | ICD-10-CM | POA: Diagnosis not present

## 2021-02-18 DIAGNOSIS — O09521 Supervision of elderly multigravida, first trimester: Secondary | ICD-10-CM | POA: Diagnosis not present

## 2021-02-18 DIAGNOSIS — Z3A10 10 weeks gestation of pregnancy: Secondary | ICD-10-CM | POA: Diagnosis not present

## 2021-02-18 DIAGNOSIS — O09891 Supervision of other high risk pregnancies, first trimester: Secondary | ICD-10-CM | POA: Diagnosis not present

## 2021-02-18 DIAGNOSIS — Z3689 Encounter for other specified antenatal screening: Secondary | ICD-10-CM | POA: Diagnosis not present

## 2021-02-18 LAB — OB RESULTS CONSOLE RUBELLA ANTIBODY, IGM: Rubella: NON-IMMUNE/NOT IMMUNE

## 2021-02-18 LAB — OB RESULTS CONSOLE HEPATITIS B SURFACE ANTIGEN: Hepatitis B Surface Ag: NEGATIVE

## 2021-02-18 LAB — OB RESULTS CONSOLE RPR: RPR: NONREACTIVE

## 2021-02-18 LAB — OB RESULTS CONSOLE GC/CHLAMYDIA
Chlamydia: NEGATIVE
Gonorrhea: NEGATIVE

## 2021-02-18 LAB — OB RESULTS CONSOLE VARICELLA ZOSTER ANTIBODY, IGG: Varicella: IMMUNE

## 2021-02-18 LAB — OB RESULTS CONSOLE HIV ANTIBODY (ROUTINE TESTING): HIV: NONREACTIVE

## 2021-03-31 DIAGNOSIS — O3442 Maternal care for other abnormalities of cervix, second trimester: Secondary | ICD-10-CM | POA: Diagnosis not present

## 2021-03-31 DIAGNOSIS — O09522 Supervision of elderly multigravida, second trimester: Secondary | ICD-10-CM | POA: Diagnosis not present

## 2021-03-31 DIAGNOSIS — O09892 Supervision of other high risk pregnancies, second trimester: Secondary | ICD-10-CM | POA: Diagnosis not present

## 2021-03-31 DIAGNOSIS — Z361 Encounter for antenatal screening for raised alphafetoprotein level: Secondary | ICD-10-CM | POA: Diagnosis not present

## 2021-03-31 DIAGNOSIS — Z3A16 16 weeks gestation of pregnancy: Secondary | ICD-10-CM | POA: Diagnosis not present

## 2021-04-10 DIAGNOSIS — O09522 Supervision of elderly multigravida, second trimester: Secondary | ICD-10-CM | POA: Diagnosis not present

## 2021-04-10 DIAGNOSIS — Z3A17 17 weeks gestation of pregnancy: Secondary | ICD-10-CM | POA: Diagnosis not present

## 2021-04-10 DIAGNOSIS — O09892 Supervision of other high risk pregnancies, second trimester: Secondary | ICD-10-CM | POA: Diagnosis not present

## 2021-04-10 DIAGNOSIS — Z331 Pregnant state, incidental: Secondary | ICD-10-CM | POA: Diagnosis not present

## 2021-04-23 DIAGNOSIS — O09892 Supervision of other high risk pregnancies, second trimester: Secondary | ICD-10-CM | POA: Diagnosis not present

## 2021-04-23 DIAGNOSIS — Z3A19 19 weeks gestation of pregnancy: Secondary | ICD-10-CM | POA: Diagnosis not present

## 2021-04-23 DIAGNOSIS — Z331 Pregnant state, incidental: Secondary | ICD-10-CM | POA: Diagnosis not present

## 2021-04-23 DIAGNOSIS — O09522 Supervision of elderly multigravida, second trimester: Secondary | ICD-10-CM | POA: Diagnosis not present

## 2021-05-21 DIAGNOSIS — O26872 Cervical shortening, second trimester: Secondary | ICD-10-CM | POA: Diagnosis not present

## 2021-05-21 DIAGNOSIS — O09522 Supervision of elderly multigravida, second trimester: Secondary | ICD-10-CM | POA: Diagnosis not present

## 2021-05-21 DIAGNOSIS — Z3A23 23 weeks gestation of pregnancy: Secondary | ICD-10-CM | POA: Diagnosis not present

## 2021-05-21 DIAGNOSIS — O09892 Supervision of other high risk pregnancies, second trimester: Secondary | ICD-10-CM | POA: Diagnosis not present

## 2021-06-11 DIAGNOSIS — Z3A26 26 weeks gestation of pregnancy: Secondary | ICD-10-CM | POA: Diagnosis not present

## 2021-06-11 DIAGNOSIS — O3442 Maternal care for other abnormalities of cervix, second trimester: Secondary | ICD-10-CM | POA: Diagnosis not present

## 2021-06-11 DIAGNOSIS — O26872 Cervical shortening, second trimester: Secondary | ICD-10-CM | POA: Diagnosis not present

## 2021-06-11 DIAGNOSIS — O09522 Supervision of elderly multigravida, second trimester: Secondary | ICD-10-CM | POA: Diagnosis not present

## 2021-06-11 DIAGNOSIS — O09892 Supervision of other high risk pregnancies, second trimester: Secondary | ICD-10-CM | POA: Diagnosis not present

## 2021-06-26 DIAGNOSIS — O09893 Supervision of other high risk pregnancies, third trimester: Secondary | ICD-10-CM | POA: Diagnosis not present

## 2021-06-26 DIAGNOSIS — Z3A28 28 weeks gestation of pregnancy: Secondary | ICD-10-CM | POA: Diagnosis not present

## 2021-06-26 DIAGNOSIS — Z331 Pregnant state, incidental: Secondary | ICD-10-CM | POA: Diagnosis not present

## 2021-06-26 DIAGNOSIS — Z23 Encounter for immunization: Secondary | ICD-10-CM | POA: Diagnosis not present

## 2021-06-26 DIAGNOSIS — Z3689 Encounter for other specified antenatal screening: Secondary | ICD-10-CM | POA: Diagnosis not present

## 2021-06-26 DIAGNOSIS — O26872 Cervical shortening, second trimester: Secondary | ICD-10-CM | POA: Diagnosis not present

## 2021-06-26 DIAGNOSIS — O09523 Supervision of elderly multigravida, third trimester: Secondary | ICD-10-CM | POA: Diagnosis not present

## 2021-07-22 DIAGNOSIS — B373 Candidiasis of vulva and vagina: Secondary | ICD-10-CM | POA: Diagnosis not present

## 2021-07-22 DIAGNOSIS — O09523 Supervision of elderly multigravida, third trimester: Secondary | ICD-10-CM | POA: Diagnosis not present

## 2021-07-22 DIAGNOSIS — O09893 Supervision of other high risk pregnancies, third trimester: Secondary | ICD-10-CM | POA: Diagnosis not present

## 2021-07-22 DIAGNOSIS — N898 Other specified noninflammatory disorders of vagina: Secondary | ICD-10-CM | POA: Diagnosis not present

## 2021-07-22 DIAGNOSIS — Z3A32 32 weeks gestation of pregnancy: Secondary | ICD-10-CM | POA: Diagnosis not present

## 2021-07-24 ENCOUNTER — Other Ambulatory Visit: Payer: Self-pay

## 2021-07-24 MED ORDER — TERCONAZOLE 0.4 % VA CREA
TOPICAL_CREAM | VAGINAL | 0 refills | Status: DC
  Filled 2021-07-24: qty 45, 7d supply, fill #0

## 2021-07-24 MED ORDER — PROGESTERONE 200 MG PO CAPS
ORAL_CAPSULE | ORAL | 0 refills | Status: DC
  Filled 2021-07-24: qty 60, 60d supply, fill #0

## 2021-07-25 ENCOUNTER — Other Ambulatory Visit: Payer: Self-pay

## 2021-07-29 ENCOUNTER — Other Ambulatory Visit: Payer: Self-pay

## 2021-07-29 MED ORDER — TERCONAZOLE 0.4 % VA CREA
TOPICAL_CREAM | VAGINAL | 0 refills | Status: DC
  Filled 2021-07-29: qty 45, 7d supply, fill #0

## 2021-08-08 ENCOUNTER — Other Ambulatory Visit: Payer: Self-pay | Admitting: Obstetrics & Gynecology

## 2021-08-14 ENCOUNTER — Other Ambulatory Visit: Payer: Self-pay

## 2021-08-19 DIAGNOSIS — O26873 Cervical shortening, third trimester: Secondary | ICD-10-CM | POA: Diagnosis not present

## 2021-08-19 DIAGNOSIS — Z3689 Encounter for other specified antenatal screening: Secondary | ICD-10-CM | POA: Diagnosis not present

## 2021-08-19 DIAGNOSIS — Z3A36 36 weeks gestation of pregnancy: Secondary | ICD-10-CM | POA: Diagnosis not present

## 2021-08-19 DIAGNOSIS — O09523 Supervision of elderly multigravida, third trimester: Secondary | ICD-10-CM | POA: Diagnosis not present

## 2021-08-19 DIAGNOSIS — Z9889 Other specified postprocedural states: Secondary | ICD-10-CM | POA: Diagnosis not present

## 2021-08-19 DIAGNOSIS — O3443 Maternal care for other abnormalities of cervix, third trimester: Secondary | ICD-10-CM | POA: Diagnosis not present

## 2021-08-19 DIAGNOSIS — O09893 Supervision of other high risk pregnancies, third trimester: Secondary | ICD-10-CM | POA: Diagnosis not present

## 2021-08-25 ENCOUNTER — Encounter (HOSPITAL_COMMUNITY): Payer: Self-pay | Admitting: *Deleted

## 2021-08-25 NOTE — Patient Instructions (Signed)
Alicia Lloyd  08/25/2021   Your procedure is scheduled on:  09/07/2021  Arrive at 0600 at Entrance C on [address] at [facility]  and [employer]. You are invited to use the FREE valet parking or use the Visitor's parking deck.  Pick up the phone at the desk and dial [phone].  Call this number if you have problems the morning of surgery: [phone]  Remember:   Do not eat food:(After Midnight) Desps de medianoche.  Do not drink clear liquids: (After Midnight) Desps de medianoche.  Take these medicines the morning of surgery with A SIP OF WATER:  none   Do not wear jewelry, make-up or nail polish.  Do not wear lotions, powders, or perfumes. Do not wear deodorant.  Do not shave 48 hours prior to surgery.  Do not bring valuables to the hospital.  [facility] is not   responsible for any belongings or valuables brought to the hospital.  Contacts, dentures or bridgework may not be worn into surgery.  Leave suitcase in the car. After surgery it may be brought to your room.  For patients admitted to the hospital, checkout time is 11:00 AM the day of              discharge.      Please read over the following fact sheets that you were given:     Preparing for Surgery

## 2021-08-26 ENCOUNTER — Telehealth (HOSPITAL_COMMUNITY): Payer: Self-pay | Admitting: *Deleted

## 2021-08-26 NOTE — Telephone Encounter (Signed)
Preadmission screen  

## 2021-08-27 ENCOUNTER — Encounter (HOSPITAL_COMMUNITY): Payer: Self-pay

## 2021-09-03 ENCOUNTER — Inpatient Hospital Stay (HOSPITAL_COMMUNITY): Payer: 59 | Admitting: Anesthesiology

## 2021-09-03 ENCOUNTER — Inpatient Hospital Stay (HOSPITAL_COMMUNITY)
Admission: AD | Admit: 2021-09-03 | Discharge: 2021-09-05 | DRG: 786 | Disposition: A | Discharge status: Home / Self Care | Payer: 59 | Attending: Obstetrics & Gynecology | Admitting: Obstetrics & Gynecology

## 2021-09-03 ENCOUNTER — Encounter (HOSPITAL_COMMUNITY): Payer: Self-pay | Admitting: Obstetrics & Gynecology

## 2021-09-03 ENCOUNTER — Other Ambulatory Visit: Payer: Self-pay

## 2021-09-03 ENCOUNTER — Encounter (HOSPITAL_COMMUNITY)
Admission: AD | Disposition: A | Discharge status: Home / Self Care | Payer: Self-pay | Source: Home / Self Care | Attending: Obstetrics & Gynecology

## 2021-09-03 DIAGNOSIS — D62 Acute posthemorrhagic anemia: Secondary | ICD-10-CM | POA: Diagnosis not present

## 2021-09-03 DIAGNOSIS — O26873 Cervical shortening, third trimester: Secondary | ICD-10-CM | POA: Diagnosis not present

## 2021-09-03 DIAGNOSIS — D649 Anemia, unspecified: Secondary | ICD-10-CM | POA: Diagnosis not present

## 2021-09-03 DIAGNOSIS — Z23 Encounter for immunization: Secondary | ICD-10-CM

## 2021-09-03 DIAGNOSIS — O34211 Maternal care for low transverse scar from previous cesarean delivery: Principal | ICD-10-CM | POA: Diagnosis present

## 2021-09-03 DIAGNOSIS — O9081 Anemia of the puerperium: Secondary | ICD-10-CM | POA: Diagnosis not present

## 2021-09-03 DIAGNOSIS — O9852 Other viral diseases complicating childbirth: Secondary | ICD-10-CM | POA: Diagnosis present

## 2021-09-03 DIAGNOSIS — Z2839 Other underimmunization status: Secondary | ICD-10-CM | POA: Diagnosis present

## 2021-09-03 DIAGNOSIS — Z3A38 38 weeks gestation of pregnancy: Secondary | ICD-10-CM | POA: Diagnosis not present

## 2021-09-03 DIAGNOSIS — Z8659 Personal history of other mental and behavioral disorders: Secondary | ICD-10-CM

## 2021-09-03 DIAGNOSIS — O99892 Other specified diseases and conditions complicating childbirth: Secondary | ICD-10-CM | POA: Diagnosis present

## 2021-09-03 DIAGNOSIS — Z98891 History of uterine scar from previous surgery: Secondary | ICD-10-CM

## 2021-09-03 DIAGNOSIS — O9902 Anemia complicating childbirth: Secondary | ICD-10-CM | POA: Diagnosis present

## 2021-09-03 DIAGNOSIS — U071 COVID-19: Secondary | ICD-10-CM | POA: Diagnosis not present

## 2021-09-03 DIAGNOSIS — Z412 Encounter for routine and ritual male circumcision: Secondary | ICD-10-CM | POA: Diagnosis not present

## 2021-09-03 LAB — URINALYSIS, ROUTINE W REFLEX MICROSCOPIC
Bilirubin Urine: NEGATIVE
Glucose, UA: NEGATIVE mg/dL
Hgb urine dipstick: NEGATIVE
Ketones, ur: 20 mg/dL — AB
Leukocytes,Ua: NEGATIVE
Nitrite: NEGATIVE
Protein, ur: NEGATIVE mg/dL
Specific Gravity, Urine: 1.01 (ref 1.005–1.030)
pH: 6 (ref 5.0–8.0)

## 2021-09-03 LAB — CBC
HCT: 32.7 % — ABNORMAL LOW (ref 36.0–46.0)
Hemoglobin: 10.7 g/dL — ABNORMAL LOW (ref 12.0–15.0)
MCH: 29.1 pg (ref 26.0–34.0)
MCHC: 32.7 g/dL (ref 30.0–36.0)
MCV: 88.9 fL (ref 80.0–100.0)
Platelets: 187 10*3/uL (ref 150–400)
RBC: 3.68 MIL/uL — ABNORMAL LOW (ref 3.87–5.11)
RDW: 13 % (ref 11.5–15.5)
WBC: 8.6 10*3/uL (ref 4.0–10.5)
nRBC: 0 % (ref 0.0–0.2)

## 2021-09-03 LAB — RESP PANEL BY RT-PCR (FLU A&B, COVID) ARPGX2
Influenza A by PCR: NEGATIVE
Influenza B by PCR: NEGATIVE
SARS Coronavirus 2 by RT PCR: POSITIVE — AB

## 2021-09-03 LAB — TYPE AND SCREEN
ABO/RH(D): O POS
Antibody Screen: NEGATIVE

## 2021-09-03 LAB — POCT FERN TEST: POCT Fern Test: NEGATIVE

## 2021-09-03 LAB — AMNISURE RUPTURE OF MEMBRANE (ROM) NOT AT ARMC: Amnisure ROM: NEGATIVE

## 2021-09-03 SURGERY — Surgical Case
Anesthesia: Spinal

## 2021-09-03 MED ORDER — MENTHOL 3 MG MT LOZG: 1.0000 | LOZENGE | OROMUCOSAL | Status: DC | PRN

## 2021-09-03 MED ORDER — NALOXONE HCL 4 MG/10ML IJ SOLN
1.0000 ug/kg/h | INTRAVENOUS | Status: DC | PRN
  Filled 2021-09-03: qty 5

## 2021-09-03 MED ORDER — FENTANYL CITRATE (PF) 100 MCG/2ML IJ SOLN: 25.0000 ug | INTRAMUSCULAR | Status: DC | PRN

## 2021-09-03 MED ORDER — LACTATED RINGERS IV SOLN: INTRAVENOUS | Status: DC

## 2021-09-03 MED ORDER — DIBUCAINE (PERIANAL) 1 % EX OINT
1.0000 | TOPICAL_OINTMENT | CUTANEOUS | Status: DC | PRN
Start: 2021-09-03 — End: 2021-09-05

## 2021-09-03 MED ORDER — NALOXONE HCL 0.4 MG/ML IJ SOLN: 0.4000 mg | INTRAMUSCULAR | Status: DC | PRN

## 2021-09-03 MED ORDER — SENNOSIDES-DOCUSATE SODIUM 8.6-50 MG PO TABS
2.0000 | ORAL_TABLET | Freq: Every day | ORAL | Status: DC
  Administered 2021-09-04 – 2021-09-05 (×2): 2 via ORAL
  Filled 2021-09-03 (×2): qty 2

## 2021-09-03 MED ORDER — DEXAMETHASONE SODIUM PHOSPHATE 10 MG/ML IJ SOLN
INTRAMUSCULAR | Status: DC | PRN
  Administered 2021-09-03: 10 mg via INTRAVENOUS

## 2021-09-03 MED ORDER — ACETAMINOPHEN 10 MG/ML IV SOLN: 1000.0000 mg | Freq: Once | INTRAVENOUS | Status: DC | PRN

## 2021-09-03 MED ORDER — FENTANYL CITRATE (PF) 100 MCG/2ML IJ SOLN
INTRAMUSCULAR | Status: DC | PRN
  Administered 2021-09-03: 15 ug via INTRAVENOUS

## 2021-09-03 MED ORDER — PROMETHAZINE HCL 25 MG/ML IJ SOLN: 6.2500 mg | INTRAMUSCULAR | Status: DC | PRN

## 2021-09-03 MED ORDER — ONDANSETRON HCL 4 MG/2ML IJ SOLN: 4.0000 mg | Freq: Three times a day (TID) | INTRAMUSCULAR | Status: DC | PRN

## 2021-09-03 MED ORDER — KETOROLAC TROMETHAMINE 30 MG/ML IJ SOLN: 30.0000 mg | Freq: Four times a day (QID) | INTRAMUSCULAR | Status: DC | PRN

## 2021-09-03 MED ORDER — OXYTOCIN-SODIUM CHLORIDE 30-0.9 UT/500ML-% IV SOLN
INTRAVENOUS | Status: AC
  Filled 2021-09-03: qty 500

## 2021-09-03 MED ORDER — ACETAMINOPHEN 500 MG PO TABS
1000.0000 mg | ORAL_TABLET | Freq: Four times a day (QID) | ORAL | Status: DC
  Administered 2021-09-04 – 2021-09-05 (×6): 1000 mg via ORAL
  Filled 2021-09-03 (×6): qty 2

## 2021-09-03 MED ORDER — NALBUPHINE HCL 10 MG/ML IJ SOLN: 5.0000 mg | Freq: Once | INTRAMUSCULAR | Status: DC | PRN

## 2021-09-03 MED ORDER — NALBUPHINE HCL 10 MG/ML IJ SOLN: 5.0000 mg | INTRAMUSCULAR | Status: DC | PRN

## 2021-09-03 MED ORDER — ONDANSETRON HCL 4 MG/2ML IJ SOLN
INTRAMUSCULAR | Status: AC
  Filled 2021-09-03: qty 2

## 2021-09-03 MED ORDER — DIPHENHYDRAMINE HCL 25 MG PO CAPS: 25.0000 mg | ORAL_CAPSULE | ORAL | Status: DC | PRN

## 2021-09-03 MED ORDER — OXYCODONE HCL 5 MG/5ML PO SOLN: 5.0000 mg | Freq: Once | ORAL | Status: DC | PRN

## 2021-09-03 MED ORDER — KETOROLAC TROMETHAMINE 30 MG/ML IJ SOLN
INTRAMUSCULAR | Status: AC
  Filled 2021-09-03: qty 1

## 2021-09-03 MED ORDER — DEXAMETHASONE SODIUM PHOSPHATE 10 MG/ML IJ SOLN
INTRAMUSCULAR | Status: AC
  Filled 2021-09-03: qty 1

## 2021-09-03 MED ORDER — ACETAMINOPHEN 10 MG/ML IV SOLN
INTRAVENOUS | Status: DC | PRN
  Administered 2021-09-03: 1000 mg via INTRAVENOUS

## 2021-09-03 MED ORDER — LACTATED RINGERS IV BOLUS
1000.0000 mL | Freq: Once | INTRAVENOUS | Status: AC
  Administered 2021-09-03: 1000 mL via INTRAVENOUS

## 2021-09-03 MED ORDER — BUPIVACAINE IN DEXTROSE 0.75-8.25 % IT SOLN
INTRATHECAL | Status: DC | PRN
  Administered 2021-09-03: 1.6 mL via INTRATHECAL

## 2021-09-03 MED ORDER — SIMETHICONE 80 MG PO CHEW
80.0000 mg | CHEWABLE_TABLET | Freq: Three times a day (TID) | ORAL | Status: DC
  Administered 2021-09-04 – 2021-09-05 (×5): 80 mg via ORAL
  Filled 2021-09-03 (×5): qty 1

## 2021-09-03 MED ORDER — SIMETHICONE 80 MG PO CHEW
80.0000 mg | CHEWABLE_TABLET | ORAL | Status: DC | PRN
  Administered 2021-09-05: 80 mg via ORAL
  Filled 2021-09-03: qty 1

## 2021-09-03 MED ORDER — SODIUM CHLORIDE 0.9 % IV SOLN
INTRAVENOUS | Status: DC | PRN
Start: 2021-09-03 — End: 2021-09-04

## 2021-09-03 MED ORDER — FAMOTIDINE IN NACL 20-0.9 MG/50ML-% IV SOLN
20.0000 mg | Freq: Once | INTRAVENOUS | Status: AC
  Administered 2021-09-03: 20 mg via INTRAVENOUS
  Filled 2021-09-03: qty 50

## 2021-09-03 MED ORDER — OXYCODONE HCL 5 MG PO TABS: 5.0000 mg | ORAL_TABLET | ORAL | Status: DC | PRN

## 2021-09-03 MED ORDER — MORPHINE SULFATE (PF) 0.5 MG/ML IJ SOLN
INTRAMUSCULAR | Status: DC | PRN
  Administered 2021-09-03: .15 mg via INTRATHECAL

## 2021-09-03 MED ORDER — ACETAMINOPHEN 500 MG PO TABS: 1000.0000 mg | ORAL_TABLET | Freq: Four times a day (QID) | ORAL | Status: DC

## 2021-09-03 MED ORDER — KETOROLAC TROMETHAMINE 30 MG/ML IJ SOLN
30.0000 mg | Freq: Four times a day (QID) | INTRAMUSCULAR | Status: AC
  Administered 2021-09-04 (×4): 30 mg via INTRAVENOUS
  Filled 2021-09-03 (×4): qty 1

## 2021-09-03 MED ORDER — PRENATAL MULTIVITAMIN CH
1.0000 | ORAL_TABLET | Freq: Every day | ORAL | Status: DC
  Administered 2021-09-04 – 2021-09-05 (×2): 1 via ORAL
  Filled 2021-09-03 (×2): qty 1

## 2021-09-03 MED ORDER — SOD CITRATE-CITRIC ACID 500-334 MG/5ML PO SOLN
30.0000 mL | Freq: Once | ORAL | Status: AC
  Administered 2021-09-03: 30 mL via ORAL
  Filled 2021-09-03: qty 30

## 2021-09-03 MED ORDER — TETANUS-DIPHTH-ACELL PERTUSSIS 5-2.5-18.5 LF-MCG/0.5 IM SUSY: 0.5000 mL | PREFILLED_SYRINGE | Freq: Once | INTRAMUSCULAR | Status: DC

## 2021-09-03 MED ORDER — SCOPOLAMINE 1 MG/3DAYS TD PT72: 1.0000 | MEDICATED_PATCH | Freq: Once | TRANSDERMAL | Status: DC

## 2021-09-03 MED ORDER — FENTANYL CITRATE (PF) 100 MCG/2ML IJ SOLN
INTRAMUSCULAR | Status: AC
  Filled 2021-09-03: qty 2

## 2021-09-03 MED ORDER — CEFAZOLIN SODIUM-DEXTROSE 2-4 GM/100ML-% IV SOLN
2.0000 g | INTRAVENOUS | Status: AC
  Administered 2021-09-03: 2 g via INTRAVENOUS
  Filled 2021-09-03: qty 100

## 2021-09-03 MED ORDER — ONDANSETRON HCL 4 MG/2ML IJ SOLN
INTRAMUSCULAR | Status: DC | PRN
  Administered 2021-09-03: 4 mg via INTRAVENOUS

## 2021-09-03 MED ORDER — DIPHENHYDRAMINE HCL 50 MG/ML IJ SOLN: 12.5000 mg | INTRAMUSCULAR | Status: DC | PRN

## 2021-09-03 MED ORDER — COCONUT OIL OIL: 1.0000 "application " | TOPICAL_OIL | Status: DC | PRN

## 2021-09-03 MED ORDER — OXYTOCIN-SODIUM CHLORIDE 30-0.9 UT/500ML-% IV SOLN: 2.5000 [IU]/h | INTRAVENOUS | Status: AC

## 2021-09-03 MED ORDER — DIPHENHYDRAMINE HCL 25 MG PO CAPS: 25.0000 mg | ORAL_CAPSULE | Freq: Four times a day (QID) | ORAL | Status: DC | PRN

## 2021-09-03 MED ORDER — ACETAMINOPHEN 10 MG/ML IV SOLN
INTRAVENOUS | Status: AC
  Filled 2021-09-03: qty 100

## 2021-09-03 MED ORDER — KETOROLAC TROMETHAMINE 30 MG/ML IJ SOLN
30.0000 mg | Freq: Four times a day (QID) | INTRAMUSCULAR | Status: DC | PRN
Start: 2021-09-03 — End: 2021-09-04
  Administered 2021-09-03: 30 mg via INTRAMUSCULAR

## 2021-09-03 MED ORDER — OXYTOCIN-SODIUM CHLORIDE 30-0.9 UT/500ML-% IV SOLN
INTRAVENOUS | Status: DC | PRN
  Administered 2021-09-03: 30 [IU] via INTRAVENOUS

## 2021-09-03 MED ORDER — SODIUM CHLORIDE 0.9% FLUSH: 3.0000 mL | INTRAVENOUS | Status: DC | PRN

## 2021-09-03 MED ORDER — MEPERIDINE HCL 25 MG/ML IJ SOLN: 6.2500 mg | INTRAMUSCULAR | Status: DC | PRN

## 2021-09-03 MED ORDER — LACTATED RINGERS IV SOLN: INTRAVENOUS | Status: DC | PRN

## 2021-09-03 MED ORDER — MORPHINE SULFATE (PF) 0.5 MG/ML IJ SOLN
INTRAMUSCULAR | Status: AC
  Filled 2021-09-03: qty 10

## 2021-09-03 MED ORDER — PHENYLEPHRINE HCL-NACL 20-0.9 MG/250ML-% IV SOLN
INTRAVENOUS | Status: DC | PRN
  Administered 2021-09-03: 60 ug/min via INTRAVENOUS

## 2021-09-03 MED ORDER — WITCH HAZEL-GLYCERIN EX PADS: 1.0000 "application " | MEDICATED_PAD | CUTANEOUS | Status: DC | PRN

## 2021-09-03 MED ORDER — OXYCODONE HCL 5 MG PO TABS: 5.0000 mg | ORAL_TABLET | Freq: Once | ORAL | Status: DC | PRN

## 2021-09-03 MED ORDER — ESCITALOPRAM OXALATE 10 MG PO TABS: 5.0000 mg | ORAL_TABLET | Freq: Every day | ORAL | Status: DC

## 2021-09-03 MED ORDER — IBUPROFEN 600 MG PO TABS
600.0000 mg | ORAL_TABLET | Freq: Four times a day (QID) | ORAL | Status: DC
  Administered 2021-09-05 (×2): 600 mg via ORAL
  Filled 2021-09-03 (×2): qty 1

## 2021-09-03 MED ORDER — ZOLPIDEM TARTRATE 5 MG PO TABS: 5.0000 mg | ORAL_TABLET | Freq: Every evening | ORAL | Status: DC | PRN

## 2021-09-03 SURGICAL SUPPLY — 35 items
BENZOIN TINCTURE PRP APPL 2/3 (GAUZE/BANDAGES/DRESSINGS) ×2 IMPLANT
CHLORAPREP W/TINT 26ML (MISCELLANEOUS) ×2 IMPLANT
CLAMP CORD UMBIL (MISCELLANEOUS) IMPLANT
CLOSURE STERI STRIP 1/2 X4 (GAUZE/BANDAGES/DRESSINGS) ×2 IMPLANT
CLOTH BEACON ORANGE TIMEOUT ST (SAFETY) ×2 IMPLANT
DRSG OPSITE POSTOP 4X10 (GAUZE/BANDAGES/DRESSINGS) ×2 IMPLANT
ELECT REM PT RETURN 9FT ADLT (ELECTROSURGICAL) ×2
ELECTRODE REM PT RTRN 9FT ADLT (ELECTROSURGICAL) ×1 IMPLANT
EXTRACTOR VACUUM KIWI (MISCELLANEOUS) IMPLANT
EXTRACTOR VACUUM M CUP 4 TUBE (SUCTIONS) IMPLANT
GLOVE BIO SURGEON STRL SZ7 (GLOVE) ×2 IMPLANT
GLOVE BIOGEL PI IND STRL 7.0 (GLOVE) ×2 IMPLANT
GLOVE BIOGEL PI INDICATOR 7.0 (GLOVE) ×2
GOWN STRL REUS W/TWL LRG LVL3 (GOWN DISPOSABLE) ×4 IMPLANT
KIT ABG SYR 3ML LUER SLIP (SYRINGE) IMPLANT
NEEDLE HYPO 25X5/8 SAFETYGLIDE (NEEDLE) IMPLANT
NS IRRIG 1000ML POUR BTL (IV SOLUTION) ×2 IMPLANT
PACK C SECTION WH (CUSTOM PROCEDURE TRAY) ×2 IMPLANT
PAD OB MATERNITY 4.3X12.25 (PERSONAL CARE ITEMS) ×2 IMPLANT
RTRCTR C-SECT PINK 25CM LRG (MISCELLANEOUS) IMPLANT
STRIP CLOSURE SKIN 1/2X4 (GAUZE/BANDAGES/DRESSINGS) IMPLANT
SUT MNCRL 0 VIOLET CTX 36 (SUTURE) ×2 IMPLANT
SUT MONOCRYL 0 CTX 36 (SUTURE) ×2
SUT PLAIN 0 NONE (SUTURE) IMPLANT
SUT PLAIN 2 0 (SUTURE)
SUT PLAIN ABS 2-0 CT1 27XMFL (SUTURE) IMPLANT
SUT VIC AB 0 CT1 27 (SUTURE) ×2
SUT VIC AB 0 CT1 27XBRD ANBCTR (SUTURE) ×2 IMPLANT
SUT VIC AB 2-0 CT1 27 (SUTURE) ×1
SUT VIC AB 2-0 CT1 TAPERPNT 27 (SUTURE) ×1 IMPLANT
SUT VIC AB 4-0 KS 27 (SUTURE) ×2 IMPLANT
SUT VICRYL 0 TIES 12 18 (SUTURE) IMPLANT
TOWEL OR 17X24 6PK STRL BLUE (TOWEL DISPOSABLE) ×2 IMPLANT
TRAY FOLEY W/BAG SLVR 14FR LF (SET/KITS/TRAYS/PACK) IMPLANT
WATER STERILE IRR 1000ML POUR (IV SOLUTION) ×4 IMPLANT

## 2021-09-03 NOTE — Anesthesia Preprocedure Evaluation (Addendum)
Anesthesia Evaluation  Patient identified by MRN, date of birth, ID band Patient awake    Reviewed: Allergy & Precautions, NPO status , Patient's Chart, lab work & pertinent test results  Airway Mallampati: I  TM Distance: >3 FB Neck ROM: Full    Dental no notable dental hx.    Pulmonary Recent URI  (COVID +, asymptomatic),    Pulmonary exam normal breath sounds clear to auscultation       Cardiovascular negative cardio ROS Normal cardiovascular exam Rhythm:Regular Rate:Normal     Neuro/Psych negative neurological ROS  negative psych ROS   GI/Hepatic negative GI ROS, Neg liver ROS,   Endo/Other  negative endocrine ROS  Renal/GU negative Renal ROS  negative genitourinary   Musculoskeletal negative musculoskeletal ROS (+)   Abdominal   Peds negative pediatric ROS (+)  Hematology  (+) anemia ,   Anesthesia Other Findings   Reproductive/Obstetrics (+) Pregnancy                            Anesthesia Physical Anesthesia Plan  ASA: 2  Anesthesia Plan: Spinal   Post-op Pain Management:    Induction:   PONV Risk Score and Plan: 2 and Treatment may vary due to age or medical condition  Airway Management Planned: Nasal Cannula  Additional Equipment: None  Intra-op Plan:   Post-operative Plan:   Informed Consent: I have reviewed the patients History and Physical, chart, labs and discussed the procedure including the risks, benefits and alternatives for the proposed anesthesia with the patient or authorized representative who has indicated his/her understanding and acceptance.       Plan Discussed with: CRNA, Anesthesiologist and Surgeon  Anesthesia Plan Comments: (Patient will be appropriately NPO at 2000. Spinal. GETA as backup. COVID precautions. Norton Blizzard, MD  )       Anesthesia Quick Evaluation

## 2021-09-03 NOTE — Anesthesia Procedure Notes (Signed)
Spinal  Patient location during procedure: OR Start time: 09/03/2021 8:55 PM End time: 09/03/2021 9:00 PM Reason for block: surgical anesthesia Staffing Performed: anesthesiologist  Anesthesiologist: Merlinda Frederick, MD Preanesthetic Checklist Completed: patient identified, IV checked, risks and benefits discussed, surgical consent, monitors and equipment checked, pre-op evaluation and timeout performed Spinal Block Patient position: sitting Prep: DuraPrep and site prepped and draped Patient monitoring: continuous pulse ox, blood pressure and heart rate Approach: midline Location: L3-4 Injection technique: single-shot Needle Needle type: Pencan  Needle gauge: 24 G Needle length: 9 cm Assessment Events: CSF return Additional Notes Functioning IV was confirmed and monitors were applied. Sterile prep and drape, including hand hygiene and sterile gloves were used. The patient was positioned and the spine was prepped. The skin was anesthetized with lidocaine.  Free flow of clear CSF was obtained prior to injecting local anesthetic into the CSF. The needle was carefully withdrawn. The patient tolerated the procedure well.

## 2021-09-03 NOTE — MAU Note (Signed)
...  Alicia Lloyd is a 35 y.o. at [redacted]w[redacted]d here in MAU reporting: CTX since her [place] appointment with Dr. Benjie Karvonen this morning. She states her CTX are currently 6-8 minutes. Patient states she was 1cm in office. She rates her CTX a 5/10 and states she is feeling them in her entire abdomen. Endorses bloody show and an increase in yellow vaginal discharge since yesterday. She is also unsure if her water has broken. She states she was urinating earlier and could not tell if she had stopped urinating or not. +FM.   Scheduled for a repeat c/s this upcoming Sunday.   Pain score:  5/10 entire abdomen  FHT: 125 initial external  Lab orders placed from triage: MAU Labor Eval

## 2021-09-03 NOTE — MAU Provider Note (Addendum)
S: Ms. Alicia Lloyd is a 35 y.o. G3P1011 at [redacted]w[redacted]d  who presents to MAU today complaining of leaking of fluid since around 2 pm,. She denies vaginal bleeding. She endorses contractions that started in the morning and  have gotten stronger and stronger throughout the day.  She reports normal fetal movement.    O: BP 125/78   Pulse 79   Temp (!) 97.5 F (36.4 C) (Oral)   Ht 5' 5.5" (1.664 m)   Wt 74.5 kg   SpO2 99%   BMI 26.91 kg/m  GENERAL: Well-developed, well-nourished female in no acute distress.  HEAD: Normocephalic, atraumatic.  CHEST: Normal effort of breathing, regular heart rate ABDOMEN: Soft, nontender, gravid PELVIC: Normal external female genitalia. Vagina is pink and rugated. Cervix with normal contour, no lesions. Normal discharge.  No pooling.   Cervical exam:  Dilation: Fingertip Exam by:: Maye Hides, CNM   Fetal Monitoring: Baseline: 130 Variability: mod Accelerations: present Decelerations: decel Contractions: q 2-3 min  Results for orders placed or performed during the hospital encounter of 09/03/21 (from the past 24 hour(s))  CBC     Status: Abnormal   Collection Time: 09/03/21  5:42 PM  Result Value Ref Range   WBC 8.6 4.0 - 10.5 K/uL   RBC 3.68 (L) 3.87 - 5.11 MIL/uL   Hemoglobin 10.7 (L) 12.0 - 15.0 g/dL   HCT 32.7 (L) 36.0 - 46.0 %   MCV 88.9 80.0 - 100.0 fL   MCH 29.1 26.0 - 34.0 pg   MCHC 32.7 30.0 - 36.0 g/dL   RDW 13.0 11.5 - 15.5 %   Platelets 187 150 - 400 K/uL   nRBC 0.0 0.0 - 0.2 %  Type and screen     Status: None   Collection Time: 09/03/21  5:45 PM  Result Value Ref Range   ABO/RH(D) O POS    Antibody Screen NEG    Sample Expiration      09/06/2021,2359 Performed at [facility], [place] [address], [place], [place] [address]   Amnisure rupture of membrane (rom)not at [facility]     Status: None   Collection Time: 09/03/21  5:52 PM  Result Value Ref Range   Amnisure ROM NEGATIVE   POCT fern test     Status: None   Collection  Time: 09/03/21  6:15 PM  Result Value Ref Range   POCT Fern Test Negative = intact amniotic membranes      A: [facility] at [redacted]w[redacted]d  Membranes intact  P: TC by CNM to Dr. Benjie Karvonen to discuss patient's presentation, physical exam. Dr. Benjie Karvonen will be at bedside shortly to talk to patient about c/section tonight.   [place], [place] 09/03/2021 7:10 PM

## 2021-09-03 NOTE — H&P (Signed)
Alicia Lloyd is a 35 y.o. female presenting for contractions, pain getting worse and pelvic pressure. She is scheduled for RCs at 39 wks. She is 39.4 wks today. Leaking some fluid. No vag bleeding.   Good PNCare AMA- Low risk NIPT and normal anatomy sono and 3rd trim growth  H/o LEEP, short cervix in pregnancy in 3rd tirm, vaginal Prometrium in 3rd trimester  H/o LTCS x 1 for low placenta    OB History     Gravida  3   Para  1   Term  1   Preterm      AB  1   Living  1      SAB      IAB      Ectopic      Multiple  0   Live Births  1          Past Medical History:  Diagnosis Date   H/O LEEP    History of HPV infection    Postpartum care following cesarean delivery (1/25) 12/17/2014   Thrombocythemia    Vaginal Pap smear, abnormal    Past Surgical History:  Procedure Laterality Date   CESAREAN SECTION N/A 12/17/2014   Procedure: Primary CESAREAN SECTION;  Surgeon: Marvene Staff, MD;  Location: [facility];  Service: Obstetrics;  Laterality: N/A;  EDD: 12/24/14   DILATION AND CURETTAGE OF UTERUS  2008   TAB   LEEP  2008   WISDOM TOOTH EXTRACTION     Family History: family history includes Cancer in her mother; Lung cancer in her maternal grandmother; Stroke in her mother. Social History:  reports that she has never smoked. She has never used smokeless tobacco. She reports that she does not drink alcohol and does not use drugs.     Maternal Diabetes: No Genetic Screening: Normal Maternal Ultrasounds/Referrals: Normal Fetal Ultrasounds or other Referrals:  None Maternal Substance Abuse:  No Significant Maternal Medications:  vaginal Prometrium for short cervix  Significant Maternal Lab Results:   Other Comments:  None  Review of Systems History Dilation: Fingertip Exam by:: Maye Hides, CNM Blood pressure 125/78, pulse 79, temperature (!) 97.5 F (36.4 C), temperature source Oral, SpO2 99 %, unknown if currently breastfeeding. Exam Physical  Exam   A&O x 3, no acute distress. Pleasant HEENT neg, no thyromegaly Lungs CTA bilat CV RRR, S1S2 normal Abdo soft, non tender, non acute Extr no edema/ tenderness Pelvic Cx 1 soft/ Vx -1  FHT  130s cat I Toco q 2-3 min, pt in pain with UCs   Prenatal labs: ABO, Rh: --/--/PENDING (10/12 1745) Antibody: PENDING (10/12 1745) Rubella: Nonimmune (03/29 0000) -> needs PP MMR RPR: Nonreactive (03/29 0000)  HBsAg: Negative (03/29 0000)  HIV: Non-reactive (03/29 0000)  GBS:   Negative  NIPT low risk AFP1 nl Glucola nl   Assessment/Plan: 35 yo G3P1011, 38.3 wks in early labor. H/o one c/section, desires repeat C/section. No plans for sterilization.  Asymptomatic Covid positive  Needs PP MMR  Risks/complications of surgery reviewed incl infection, bleeding, damage to internal organs including bladder, bowels, ureters, blood vessels, other risks from anesthesia, VTE and delayed complications of any surgery, complications in future surgery reviewed. Also discussed neonatal complications incl difficult delivery, laceration, vacuum assistance, TTN etc. Pt understands and agrees, all concerns addressed.     Elveria Royals 09/03/2021, 6:33 PM

## 2021-09-04 ENCOUNTER — Encounter (HOSPITAL_COMMUNITY): Payer: Self-pay | Admitting: Obstetrics & Gynecology

## 2021-09-04 DIAGNOSIS — O99892 Other specified diseases and conditions complicating childbirth: Secondary | ICD-10-CM | POA: Diagnosis present

## 2021-09-04 DIAGNOSIS — O9902 Anemia complicating childbirth: Secondary | ICD-10-CM | POA: Diagnosis present

## 2021-09-04 DIAGNOSIS — Z2839 Other underimmunization status: Secondary | ICD-10-CM | POA: Diagnosis present

## 2021-09-04 LAB — CBC
HCT: 27.9 % — ABNORMAL LOW (ref 36.0–46.0)
Hemoglobin: 9.2 g/dL — ABNORMAL LOW (ref 12.0–15.0)
MCH: 29.3 pg (ref 26.0–34.0)
MCHC: 33 g/dL (ref 30.0–36.0)
MCV: 88.9 fL (ref 80.0–100.0)
Platelets: 166 10*3/uL (ref 150–400)
RBC: 3.14 MIL/uL — ABNORMAL LOW (ref 3.87–5.11)
RDW: 13 % (ref 11.5–15.5)
WBC: 13.3 10*3/uL — ABNORMAL HIGH (ref 4.0–10.5)
nRBC: 0 % (ref 0.0–0.2)

## 2021-09-04 LAB — RPR: RPR Ser Ql: NONREACTIVE

## 2021-09-04 MED ORDER — POLYSACCHARIDE IRON COMPLEX 150 MG PO CAPS
150.0000 mg | ORAL_CAPSULE | Freq: Every day | ORAL | Status: DC
  Filled 2021-09-04 (×2): qty 1

## 2021-09-04 MED ORDER — MAGNESIUM OXIDE -MG SUPPLEMENT 400 (240 MG) MG PO TABS
400.0000 mg | ORAL_TABLET | Freq: Every day | ORAL | Status: DC
  Filled 2021-09-04 (×2): qty 1

## 2021-09-04 MED ORDER — FERROUS SULFATE 325 (65 FE) MG PO TABS: 325.0000 mg | ORAL_TABLET | Freq: Every day | ORAL | Status: DC

## 2021-09-04 MED ORDER — LACTATED RINGERS IV SOLN: INTRAVENOUS | Status: DC

## 2021-09-04 MED ORDER — LACTATED RINGERS IV BOLUS
300.0000 mL | Freq: Once | INTRAVENOUS | Status: AC
  Administered 2021-09-04: 300 mL via INTRAVENOUS

## 2021-09-04 MED ORDER — MEASLES, MUMPS & RUBELLA VAC IJ SOLR
0.5000 mL | Freq: Once | INTRAMUSCULAR | Status: AC | PRN
Start: 2021-09-04 — End: 2021-09-05
  Administered 2021-09-05: 0.5 mL via SUBCUTANEOUS
  Filled 2021-09-04: qty 0.5

## 2021-09-04 NOTE — Transfer of Care (Signed)
Immediate Anesthesia Transfer of Care Note  Patient: Alicia Lloyd  Procedure(s) Performed: Repeat CESAREAN SECTION  Patient Location: PACU  Anesthesia Type:Spinal  Level of Consciousness: awake, alert  and oriented  Airway & Oxygen Therapy: Patient Spontanous Breathing  Post-op Assessment: Report given to RN and Post -op Vital signs reviewed and stable  Post vital signs: Reviewed and stable  Last Vitals:  Vitals Value Taken Time  BP 135/72 09/03/21 2218  Temp 36.6 C 09/03/21 2218  Pulse 81 09/03/21 2218  Resp 18 09/03/21 2218  SpO2 98 % 09/03/21 2218    Last Pain:  Vitals:   09/04/21 0545  TempSrc: Oral  PainSc: 2          Complications: No notable events documented.

## 2021-09-04 NOTE — Anesthesia Postprocedure Evaluation (Signed)
Anesthesia Post Note  Patient: Alicia Lloyd  Procedure(s) Performed: Repeat CESAREAN SECTION     Patient location during evaluation: PACU Anesthesia Type: Spinal Level of consciousness: oriented and awake and alert Pain management: pain level controlled Vital Signs Assessment: post-procedure vital signs reviewed and stable Respiratory status: spontaneous breathing and respiratory function stable Cardiovascular status: blood pressure returned to baseline and stable Postop Assessment: no headache, no backache, no apparent nausea or vomiting and spinal receding Anesthetic complications: no   No notable events documented.  Last Vitals:  Vitals:   09/03/21 2356 09/04/21 0109  BP: 129/78 119/75  Pulse: 92 98  Resp: 18 18  Temp: 36.8 C 36.9 C  SpO2: 98% 98%    Last Pain:  Vitals:   09/04/21 0111  TempSrc:   PainSc: 2    Pain Goal:                   Merlinda Frederick

## 2021-09-04 NOTE — Lactation Note (Signed)
This note was copied from a baby's chart. Lactation Consultation Note  Patient Name: Alicia Lloyd [id number]'P Date: 09/04/2021 Reason for consult: Follow-up assessment;Early term 37-38.6wks;Mother's request Age:35 hours  Mom feels her nipples are somewhat tender.  They appear slightly pink.  Slight compression bruise noted down the center of the nipple.  Mom is able to hand express.    Mom attempted to latch baby with [place] assistance in football hold.  Infant tucks in top lip when latching and during feeding.  Lip is difficult to flange.  A few swallows heard after using gentle massage and compression during the feeding.    Mom states their is a slight pinch during feeding.  When infant is removed, ridge and compression appear along midline of nipple.  LC suggested trying different positions and to continue to do STS and hand express prior to latching.    Employee pump provided for mom.        Maternal Data Has patient been taught Hand Expression?: Yes  Feeding Mother's Current Feeding Choice: Breast Milk  LATCH Score Latch: Grasps breast easily, tongue down, lips flanged, rhythmical sucking.  Audible Swallowing: A few with stimulation  Type of Nipple: Everted at rest and after stimulation  Comfort (Breast/Nipple): Filling, red/small blisters or bruises, mild/mod discomfort  Hold (Positioning): Assistance needed to correctly position infant at breast and maintain latch.  LATCH Score: 7   Lactation Tools Discussed/Used Tools: Comfort gels;Pump (tenderness from compression on nipple, mom has a hand pump) Breast pump type: Manual  Interventions Interventions: Breast feeding basics reviewed;Assisted with latch;Skin to skin;Hand express;Adjust position;Support pillows;Position options;Comfort gels  Discharge Pump: Employee Pump [facility] provided employee pump)  Consult Status Consult Status: Follow-up Date: 09/05/21 Follow-up type: In-patient    Ferne Coe  [facility] 09/04/2021, 9:04 PM

## 2021-09-04 NOTE — Progress Notes (Addendum)
   Subjective: POD# 1 Live born female  Birth Weight: 7 lb 9.5 oz (3445 g) APGAR: 9, 9  Newborn Delivery   Birth date/time: 09/03/2021 21:32:00 Delivery type: C-Section, Low Transverse Trial of labor: No C-section categorization: Repeat     Baby name: Duanne Limerick Delivering provider: MODY, VAISHALI   circumcision planned Feeding: breast  Pain control at delivery: Spinal   Reports feeling well.  Patient reports tolerating PO.   Breast symptoms:good latch, + colostrum Pain controlled with  Tylenol and Toradol Denies HA/SOB/C/P/N/V/dizziness. Flatus present. She reports vaginal bleeding as normal, without clots.  She has stood at [facility] w/o difficulty, foley cath indwelling.   Objective:   VS:    Vitals:   09/04/21 0109 09/04/21 0320 09/04/21 0545 09/04/21 0730  BP: 119/75  115/72   Pulse: 98  81   Resp: _0 Temp: 98.4 F (36.9 C)  97.9 F (36.6 C)   TempSrc: Oral  Oral   SpO2: 98% 97% 98% 98%  Weight:      Height:         Intake/Output Summary (Last 24 hours) at 09/04/2021 0849 Last data filed at 09/04/2021 0600 Gross per 24 hour  Intake 3935.12 ml  Output 516 ml  Net 3419.12 ml        Recent Labs    09/03/21 1742 09/04/21 0650  WBC 8.6 13.3*  HGB 10.7* 9.2*  HCT 32.7* 27.9*  PLT 187 166     Blood type: --/--/O POS (10/12 1745)  Rubella: Nonimmune (03/29 0000)  Vaccines: TDaP          UTD         Flu             UTD                    COVID-19 UTD   Physical Exam:  General: alert, cooperative, and no distress CV: Regular rate and rhythm Resp: clear Abdomen: soft, nontender, normal bowel sounds Incision: clean, dry, and intact Uterine Fundus: firm, below umbilicus, nontender Lochia: minimal Ext: no edema, redness or tenderness in the calves or thighs  Assessment/Plan: 35 y.o.   POD# 1. [id number]                  Principal Problem:   Postpartum care following cesarean delivery 10/12 Active Problems:   Previous cesarean section    Asymptomatic COVID-19 virus infection   History of anxiety   Maternal anemia, with delivery - IDA w/ ABL  - start oral Fe and Mag Ox   Rubella non-immune status, delivered, current hospitalization  - MMR prior to DC  Doing well, stable.               Advance diet as tolerated Encourage rest when baby rests Breastfeeding support Encourage to ambulate Routine post-op care  Juliene Pina, CNM, MSN 09/04/2021, 8:49 AM

## 2021-09-04 NOTE — Op Note (Signed)
Cesarean Section Procedure Note   [name]  09/03/2021 Procedure: Emergency Repeat Low Transverse Cesarean section   Indications:  Early labor, 38.3 weeks, history of one C-section, desiring repeat c-section     Pre-operative Diagnosis: Previous Cesarean Section; early labor, requests repeat cesarean.                                              Asymptomatic Covid 19 positive   Post-operative Diagnosis: Same   Surgeon:  Azucena Fallen, MD  Assistants: Lars Pinks, CNM   Anesthesia: spinal   Procedure Details:  The patient was seen in the Maternity triage Room. The risks, benefits, complications, treatment options, and expected outcomes were discussed with the patient. The patient concurred with the proposed plan, giving informed consent. identified as [name] and the procedure verified as C-Section Delivery. In OR, a Time Out was held and the above information confirmed. 2 gm Ancef given.  After induction of anesthesia, the patient was draped and prepped in the usual sterile manner, foley was draining urine well.  A pfannenstiel incision was made and carried down through the subcutaneous tissue to the fascia. Fascial incision was made and extended transversely. The fascia was separated from the underlying rectus tissue superiorly and inferiorly. The peritoneum was identified and entered. Peritoneal incision was extended longitudinally. Alexis-O retractor placed. Bladder peritoneum was not incised.  A low transverse uterine incision was made. Copious clear amniotic fluid drained. At head delivery nuchal cord x 3 noted and released over the head and then rest of the baby delivered. FEMALE infant with vigorous cry. Apgar scores of 9 at one minute and 9 at five minutes. Delayed cord clamping done at 1 minute and baby handed to NICU team in attendance. Cord ph was sent. Cord blood was obtained for evaluation. The placenta was removed Intact and appeared normal. The uterine outline,  tubes and ovaries appeared normal}. The uterine incision was closed with running locked sutures of 0-Monocryl in single layer since hemostasis was observed. Alexis retractor removed. Peritoneal closure done with 2-0 Vicryl.  The fascia was then reapproximated with running sutures of 0Vicryl. The subcuticular closure was performed using 2-0plain gut. The skin was closed with 4-0Vicryl. Steristrips, honeycomb dressing placed.  Instrument, sponge, and needle counts were correct prior the abdominal closure and were correct at the conclusion of the case.   Findings: Nuchal cord x 3. Female infant delivered cephalic from [facility] hysterotomy. Normal placenta, tubes, ovaries.    Estimated Blood Loss:  181 cc  Total IV Fluids: 2250 ml LR  Urine Output:  50CC OF clear urine  Specimens: cord blood   Complications: no complications  Disposition: PACU - hemodynamically stable.   Maternal Condition: stable   Baby condition / location:  Couplet care / Skin to Skin  Attending Attestation: I performed the procedure.   Signed: Surgeon(s): Azucena Fallen, MD

## 2021-09-04 NOTE — Lactation Note (Addendum)
This note was copied from a baby's chart. Lactation Consultation Note  Patient Name: Alicia Lloyd [id number] Date: 09/04/2021 Reason for consult: Initial assessment Age:35 hours P2, mother reports that she breastfed her first child for 16 months without any problems.  Mother reports that she feels that infant is getting a shallow latch. She has a pinched ridge on the side of her nipple when infant releases the breast. Infant sustained latch for 10 mins at two different feeds.   Observed a few swallows and some chewing at the breast.   Mother advised in good support and off sided latch technique.  Mother ask if I would assess infants oral cavity for ties. Observed that infant has a slight upper lip tie, but rolls upward with flipping with finger. Infant has a short palate. Mothers nipple is long.   Observed that infant has a thin posterior tongue tie. Infant does have slight lateral mobility of his tongue.  Observed pinched nipple each time infant released the breast. Assist mother with hand expression and observed a few tiny drops of colostrum.  Suggested massage/ hand expression and continue cue base. Discussed cluster feeding.   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 Mother will let LC know.  Mother reports that she needs #21 flanges.  #21 flanges to be taken back to her room . Mother to page for [facility] as needed.    Maternal Data Has patient been taught Hand Expression?: Yes Does the patient have breastfeeding experience prior to this delivery?: Yes How long did the patient breastfeed?: 16 months  Feeding Mother's Current Feeding Choice: Breast Milk  LATCH Score Latch: Grasps breast easily, tongue down, lips flanged, rhythmical sucking.  Audible Swallowing: A few with stimulation  Type of Nipple: Everted at rest and after stimulation  Comfort (Breast/Nipple): Filling, red/small blisters or bruises, mild/mod discomfort  Hold  (Positioning): Assistance needed to correctly position infant at breast and maintain latch.  LATCH Score: 7   Lactation Tools Discussed/Used    Interventions Interventions: Breast feeding basics reviewed;Assisted with latch;Skin to skin;Hand express;Pre-pump if needed;Hand pump;Adjust position;Support pillows;Position options;Education;LC Services brochure  Discharge    Consult Status Consult Status: Follow-up Date: 09/04/21 Follow-up type: In-patient    Jess Barters [facility] 09/04/2021, 3:38 PM

## 2021-09-05 ENCOUNTER — Other Ambulatory Visit: Payer: Self-pay

## 2021-09-05 ENCOUNTER — Encounter (HOSPITAL_COMMUNITY)
Admission: RE | Admit: 2021-09-05 | Discharge: 2021-09-05 | Disposition: A | Discharge status: Home / Self Care | Payer: 59 | Source: Ambulatory Visit | Attending: Obstetrics & Gynecology | Admitting: Obstetrics & Gynecology

## 2021-09-05 MED ORDER — POLYSACCHARIDE IRON COMPLEX 150 MG PO CAPS
150.0000 mg | ORAL_CAPSULE | Freq: Every day | ORAL | 0 refills | Status: DC
  Filled 2021-09-05: qty 60, 60d supply, fill #0

## 2021-09-05 MED ORDER — ACETAMINOPHEN 500 MG PO TABS
1000.0000 mg | ORAL_TABLET | Freq: Four times a day (QID) | ORAL | 0 refills | Status: DC
  Filled 2021-09-05: qty 60, 8d supply, fill #0

## 2021-09-05 MED ORDER — COCONUT OIL OIL: 1.0000 "application " | TOPICAL_OIL | 0 refills | Status: DC | PRN

## 2021-09-05 MED ORDER — IBUPROFEN 600 MG PO TABS
600.0000 mg | ORAL_TABLET | Freq: Four times a day (QID) | ORAL | 0 refills | Status: DC
  Filled 2021-09-05: qty 30, 8d supply, fill #0

## 2021-09-05 NOTE — Lactation Note (Signed)
This note was copied from a baby's chart. Lactation Consultation Note  Patient Name: Alicia Lloyd [id number] Date: 09/05/2021 Reason for consult: Follow-up assessment;Mother's request;Early term 37-38.6wks;Infant weight loss (-5% weight loss, C/Section delivery) Age:35 hours Per mom, she has positional stripes on both breast  that are healing, sometimes she is feeling a pinch with infant's latch. Mom has only been using the cross cradle hold position, LC unable to help with latch due to infant being sleep. LC discussed with mom to flange infant's top lip out and bottom lip, if she feels pinch to re-latch infant at the breast,  to prevent further abrasions on breast.  LC suggested other breastfeeding positions such as the football hold. LC suggested mom do suck training with infant , flanging infant's top and bottom lip outward. LC discussed the following for patient discharge: 1- Continue to breastfeed infant by cues, 8 to 12+ or more times within 24 hours. 2- Mom can hand express and give infant extra volume of EBM  after latching infant at the breast. 3-LC discussed engorgement treatment and prevention and referred online resource "[employer]." 4-LC discussed warning signs and dehydration infant, to look at feeding patterns, infant's input and output with parents.  Maternal Data    Feeding Mother's Current Feeding Choice: Breast Milk  LATCH Score                    Lactation Tools Discussed/Used    Interventions    Discharge Discharge Education: Engorgement and breast care;Warning signs for feeding baby;Outpatient recommendation  Consult Status Consult Status: Complete Date: 09/05/21 Follow-up type: Physician    Vicente Serene 09/05/2021, 4:03 PM

## 2021-09-05 NOTE — Discharge Summary (Signed)
Postpartum Discharge Summary  Date of Service updated 1014/22     Patient Name: Alicia Lloyd DOB: 05/10/1986 MRN: [id number]  Date of admission: 09/03/2021 Delivery date:09/03/2021  Delivering provider: MODY, VAISHALI  Date of discharge: 09/05/2021  Admitting diagnosis: Previous cesarean section [Z98.891] Intrauterine pregnancy: [redacted]w[redacted]d    Secondary diagnosis:  Principal Problem:   Postpartum care following cesarean delivery 10/12 Active Problems:   Previous cesarean section   Asymptomatic COVID-19 virus infection   History of anxiety   Maternal anemia, with delivery - IDA w/ ABL   Rubella non-immune status, delivered, current hospitalization  Additional problems:     Discharge diagnosis: Term Pregnancy Delivered and Anemia                                              Post partum procedures: n/a Augmentation: N/A Complications: None  Hospital course: Sceduled C/S   35y.o. yo [id number] admitted to the hospital 09/03/2021 for scheduled cesarean section with the following indication: early labor, previous c/s x1 .Delivery details are as follows:  Membrane Rupture Time/Date: 9:31 PM ,09/03/2021   Delivery Method:C-Section, Low Transverse  Details of operation can be found in separate operative note.  Patient had an uncomplicated postpartum course.  She tested positive for Covid, but was asymptomatic. She is ambulating, tolerating a regular diet, passing flatus, and urinating well. Patient is discharged home in stable condition on  09/05/21        Newborn Data: Birth date:09/03/2021  Birth time:9:32 PM  Gender:Female  Living status:Living  Apgars:9 ,9  [id number]    "SaDuanne Limerick Magnesium Sulfate received: No BMZ received: No Rhophylac:N/A MMR:Yes given 09/05/21 T-DaP:Given prenatally Flu: Yes Transfusion:No  Physical exam  Vitals:   09/04/21 0921 09/04/21 1243 09/04/21 2213 09/05/21 0500  BP: 96/64 109/72 122/67 116/82  Pulse: 71 74 72 68  Resp: 16  16 16 18   Temp: 97.6 F (36.4 C)  (!) 97.5 F (36.4 C) 97.6 F (36.4 C)  TempSrc: Oral  Oral Oral  SpO2: 98% 99%    Weight:      Height:       General: alert, cooperative, and no distress Heart: RRR Lungs: clear, equal bilaterally  Abdomen: non-distended, active bowel sounds Lochia: appropriate Uterine Fundus: firm, below umbilicus  Incision: Healing well with no significant drainage, No significant erythema, Dressing is clean, dry, and intact DVT Evaluation: No evidence of DVT seen on physical exam. Negative Homan's sign. No cords or calf tenderness. Calf/Ankle edema is present Labs: Lab Results  Component Value Date   WBC 13.3 (H) 09/04/2021   HGB 9.2 (L) 09/04/2021   HCT 27.9 (L) 09/04/2021   MCV 88.9 09/04/2021   PLT 166 09/04/2021   No flowsheet data found. Edinburgh Score: Edinburgh Postnatal Depression Scale Screening Tool 09/05/2021  I have been able to laugh and see the funny side of things. 0  I have looked forward with enjoyment to things. 0  I have blamed myself unnecessarily when things went wrong. 0  I have been anxious or worried for no good reason. 1  I have felt scared or panicky for no good reason. 0  Things have been getting on top of me. 1  I have been so unhappy that I have had difficulty sleeping. 0  I have felt sad or miserable. 0  I have been so unhappy that I have been crying. 0  The thought of harming myself has occurred to me. 0  Edinburgh Postnatal Depression Scale Total 2      After visit meds:  Allergies as of 09/05/2021   No Known Allergies      Medication List     TAKE these medications    acetaminophen 500 MG tablet Commonly known as: TYLENOL Take 2 tablets (1,000 mg total) by mouth every 6 (six) hours.   coconut oil Oil Apply 1 application topically as needed.   docusate sodium 100 MG capsule Commonly known as: COLACE Take 100 mg by mouth every other day. At bedstime   escitalopram 20 MG tablet Commonly known as:  LEXAPRO Take 5 mg by mouth at bedtime.   ibuprofen 600 MG tablet Commonly known as: ADVIL Take 1 tablet (600 mg total) by mouth every 6 (six) hours.   iron polysaccharides 150 MG capsule Commonly known as: NIFEREX Take 1 capsule (150 mg total) by mouth daily. Start taking on: September 06, 2021   prenatal multivitamin Tabs tablet Take 1 tablet by mouth daily at 12 noon.         Discharge home in stable condition Infant Feeding: Breast Infant Disposition:home with mother Discharge instruction: per After Visit Summary and Postpartum booklet. Activity: Advance as tolerated. Pelvic rest for 6 weeks.  Diet: low salt diet Anticipated Birth Control:  not discussed Postpartum Appointment:6 weeks Additional Postpartum F/U: Postpartum Depression checkup Future Appointments:No future appointments. Follow up Visit:  Follow-up Information     Azucena Fallen, MD Follow up in 6 week(s).   Specialty: Obstetrics and Gynecology Why: Postpartum visit Contact information: [address] [place] [address] [phone]                     09/05/2021 Darliss Cheney, CNM

## 2021-09-07 ENCOUNTER — Inpatient Hospital Stay (HOSPITAL_COMMUNITY): Admission: RE | Admit: 2021-09-07 | Payer: 59 | Source: Home / Self Care | Admitting: Obstetrics & Gynecology

## 2021-09-09 ENCOUNTER — Other Ambulatory Visit: Payer: Self-pay

## 2021-09-09 MED ORDER — HYDROCORTISONE (PERIANAL) 2.5 % EX CREA
TOPICAL_CREAM | CUTANEOUS | 0 refills | Status: DC
  Filled 2021-09-09: qty 30, 15d supply, fill #0

## 2021-09-15 ENCOUNTER — Other Ambulatory Visit: Payer: Self-pay

## 2021-09-15 DIAGNOSIS — N898 Other specified noninflammatory disorders of vagina: Secondary | ICD-10-CM | POA: Diagnosis not present

## 2021-09-15 DIAGNOSIS — B3731 Acute candidiasis of vulva and vagina: Secondary | ICD-10-CM | POA: Diagnosis not present

## 2021-09-15 MED ORDER — FLUCONAZOLE 150 MG PO TABS
ORAL_TABLET | ORAL | 0 refills | Status: DC
  Filled 2021-09-15: qty 1, 1d supply, fill #0

## 2021-09-16 ENCOUNTER — Telehealth (HOSPITAL_COMMUNITY): Payer: Self-pay | Admitting: *Deleted

## 2021-09-16 NOTE — Telephone Encounter (Signed)
Patient reported that she had an episode of elevated blood pressure today with a headache and visual changes. She has contacted her OB and has an appointment for evaluation scheduled for tomorrow. Patient voiced no other questions or concerns regarding her own health. EPDS = 2. Patient voiced no questions or concerns regarding baby at this time. Patient reported infant sleeps in a bassinet on his back. RN reviewed ABCs of safe sleep - patient verbalized understanding. Patient requested RN email information on hospital's virtual breastfeeding support groups. Email sent. Erline Levine, RN, 09/16/21, [phone]

## 2021-09-17 DIAGNOSIS — Z0130 Encounter for examination of blood pressure without abnormal findings: Secondary | ICD-10-CM | POA: Diagnosis not present

## 2021-10-20 ENCOUNTER — Other Ambulatory Visit: Payer: Self-pay

## 2021-10-20 DIAGNOSIS — N760 Acute vaginitis: Secondary | ICD-10-CM | POA: Diagnosis not present

## 2021-10-20 DIAGNOSIS — F419 Anxiety disorder, unspecified: Secondary | ICD-10-CM | POA: Diagnosis not present

## 2021-10-20 DIAGNOSIS — N898 Other specified noninflammatory disorders of vagina: Secondary | ICD-10-CM | POA: Diagnosis not present

## 2021-10-20 MED ORDER — METRONIDAZOLE 0.75 % VA GEL
VAGINAL | 0 refills | Status: DC
  Filled 2021-10-20: qty 70, 5d supply, fill #0

## 2021-10-20 MED ORDER — ESCITALOPRAM OXALATE 20 MG PO TABS
ORAL_TABLET | ORAL | 3 refills | Status: DC
  Filled 2021-10-20: qty 90, 90d supply, fill #0

## 2021-11-10 DIAGNOSIS — K642 Third degree hemorrhoids: Secondary | ICD-10-CM | POA: Diagnosis not present

## 2021-11-10 DIAGNOSIS — O9213 Cracked nipple associated with lactation: Secondary | ICD-10-CM | POA: Diagnosis not present

## 2022-08-11 ENCOUNTER — Other Ambulatory Visit: Payer: Self-pay

## 2022-08-11 MED ORDER — ESCITALOPRAM OXALATE 20 MG PO TABS
20.0000 mg | ORAL_TABLET | Freq: Every day | ORAL | 2 refills | Status: DC
  Filled 2022-08-11: qty 90, 90d supply, fill #0

## 2022-09-01 ENCOUNTER — Other Ambulatory Visit: Payer: Self-pay

## 2022-11-17 ENCOUNTER — Ambulatory Visit: Payer: 59 | Admitting: Family

## 2022-12-01 ENCOUNTER — Ambulatory Visit (INDEPENDENT_AMBULATORY_CARE_PROVIDER_SITE_OTHER): Payer: 59 | Admitting: Family

## 2022-12-01 ENCOUNTER — Encounter: Payer: Self-pay | Admitting: Family

## 2022-12-01 VITALS — BP 100/70 | HR 70 | Temp 97.1°F | Ht 65.6 in | Wt 129.4 lb

## 2022-12-01 DIAGNOSIS — Z8659 Personal history of other mental and behavioral disorders: Secondary | ICD-10-CM

## 2022-12-01 DIAGNOSIS — Z789 Other specified health status: Secondary | ICD-10-CM | POA: Diagnosis not present

## 2022-12-01 DIAGNOSIS — Z0000 Encounter for general adult medical examination without abnormal findings: Secondary | ICD-10-CM | POA: Diagnosis not present

## 2022-12-01 DIAGNOSIS — F411 Generalized anxiety disorder: Secondary | ICD-10-CM | POA: Insufficient documentation

## 2022-12-01 DIAGNOSIS — D508 Other iron deficiency anemias: Secondary | ICD-10-CM

## 2022-12-01 DIAGNOSIS — Z13220 Encounter for screening for lipoid disorders: Secondary | ICD-10-CM

## 2022-12-01 NOTE — Addendum Note (Signed)
Addended by: Ellamae Sia on: 12/01/2022 03:57 PM   Modules accepted: Orders

## 2022-12-01 NOTE — Progress Notes (Signed)
New Patient Office Visit  Subjective:  Patient ID: Alicia Lloyd, female    DOB: 1986/06/24  Age: 37 y.o. MRN: [id number]  CC:  Chief Complaint  Patient presents with   Establish Care   Annual Exam    HPI Alicia Lloyd is here to establish care as a new patient as well as here for annual exam.  Prior provider was: has not had a pcp in a while.  Does see OBGYN Dr. Benjie Karvonen  Pt is without acute concerns.   Fmh h/o uterine cancer in mom , mom was 57 y/o.  She is seeing obgyn for regular f/u , possible u/s to start around 37 y/o  H/o leep procedure with precancerous lesions back in 2013? Or so.  Negative paps since.  Mammograms: <40 y/o   chronic concerns:  IDA: taking daily prenatal, can not tolerate iron well as she already has constipation at times.   GAD: had started taking lexapro back in 2019, when anxiety started and she went off of this because decreased libido, she has not tried anything new since.. A lot going on at home between marriage concerns as well as increased anxiety. She has not recently seen a therapist however she does state used [employer] through her employer years past. She has tried marriage counseling however husband is resistant.  Past Medical History:  Diagnosis Date   H/O LEEP    History of HPV infection    Postpartum care following cesarean delivery (1/25) 12/17/2014   Thrombocythemia    Vaginal Pap smear, abnormal     Past Surgical History:  Procedure Laterality Date   CESAREAN SECTION N/A 12/17/2014   Procedure: Primary CESAREAN SECTION;  Surgeon: Marvene Staff, MD;  Location: [facility];  Service: Obstetrics;  Laterality: N/A;  EDD: 12/24/14   CESAREAN SECTION N/A 09/03/2021   Procedure: Repeat CESAREAN SECTION;  Surgeon: Azucena Fallen, MD;  Location: [facility];  Service: Obstetrics;  Laterality: N/A;  EDD: 09/14/21   DILATION AND CURETTAGE OF UTERUS  11/23/2006   aborption   LEEP  11/23/2006   WISDOM TOOTH EXTRACTION      Family  History  Problem Relation Age of Onset   Stroke Mother        TIA, smoker   Uterine cancer Mother    Celiac disease Father    Cholecystitis Father    Lung cancer Maternal Grandmother     Social History   Socioeconomic History   Marital status: Married    Spouse name: Not on file   Number of children: Not on file   Years of education: Not on file   Highest education level: Not on file  Occupational History    Employer: [employer]   Tobacco Use   Smoking status: Never   Smokeless tobacco: Never  Vaping Use   Vaping Use: Never used  Substance and Sexual Activity   Alcohol use: No   Drug use: No   Sexual activity: Yes    Partners: Male    Birth control/protection: Surgical    Comment: husband vasectomy  Other Topics Concern   Not on file  Social History Narrative   Not on file   Social Determinants of Health   Financial Resource Strain: Not on file  Food Insecurity: Not on file  Transportation Needs: Not on file  Physical Activity: Not on file  Stress: Not on file  Social Connections: Not on file  Intimate Partner Violence: Not on file    Outpatient  Medications Prior to Visit  Medication Sig Dispense Refill   coconut oil OIL Apply 1 application topically as needed.  0   ibuprofen (ADVIL) 600 MG tablet Take 1 tablet (600 mg total) by mouth every 6 (six) hours. 30 tablet 0   Prenatal Vit-Fe Fumarate-FA (PRENATAL MULTIVITAMIN) TABS tablet Take 1 tablet by mouth daily at 12 noon.     escitalopram (LEXAPRO) 20 MG tablet Take 5 mg by mouth at bedtime.     escitalopram (LEXAPRO) 20 MG tablet Take 1 tablet (20 mg total) by mouth daily. 90 tablet 2   acetaminophen (TYLENOL) 500 MG tablet Take 2 tablets (1,000 mg total) by mouth every 6 (six) hours. (Patient not taking: Reported on 12/01/2022) 60 tablet 0   docusate sodium (COLACE) 100 MG capsule Take 100 mg by mouth every other day. At bedstime (Patient not taking: Reported on 12/01/2022)     escitalopram (LEXAPRO) 20 MG  tablet Take 1 tablet every day by oral route. (Patient not taking: Reported on 12/01/2022) 90 tablet 3   fluconazole (DIFLUCAN) 150 MG tablet Take 1 tablet by oral route. (Patient not taking: Reported on 12/01/2022) 1 tablet 0   hydrocortisone (ANUSOL-HC) 2.5 % rectal cream APPLY A THIN LAYER TO THE AFFECTED AREA(S) BY TOPICAL ROUTE 2-4 TIMES DAILY AS NEEDED (Patient not taking: Reported on 12/01/2022) 30 g 0   iron polysaccharides (NIFEREX) 150 MG capsule Take 1 capsule (150 mg total) by mouth daily. 60 capsule 0   metroNIDAZOLE (METROGEL) 0.75 % vaginal gel Insert 1 applicatorful every day by vaginal route at bedtime for 5 days. (Patient not taking: Reported on 12/01/2022) 70 g 0   No facility-administered medications prior to visit.    Allergies  Allergen Reactions   Lexapro [Escitalopram] Other (See Comments)    Decreased libido     ROS Review of Systems  Review of Systems  Respiratory:  Negative for shortness of breath.   Cardiovascular:  Negative for chest pain and palpitations.  Gastrointestinal:  Negative for constipation and diarrhea.  Genitourinary:  Negative for dysuria, frequency and urgency.  Musculoskeletal:  Negative for myalgias.  Psychiatric/Behavioral:  Negative for depression and suicidal ideas.   All other systems reviewed and are negative.    Objective:    Physical Exam Vitals reviewed.  Constitutional:      General: She is not in acute distress.    Appearance: Normal appearance. She is normal weight. She is not ill-appearing or toxic-appearing.  HENT:     Right Ear: Tympanic membrane normal.     Left Ear: Tympanic membrane normal.     Mouth/Throat:     Mouth: Mucous membranes are moist.     Pharynx: No pharyngeal swelling.     Tonsils: No tonsillar exudate.  Eyes:     Extraocular Movements: Extraocular movements intact.     Conjunctiva/sclera: Conjunctivae normal.     Pupils: Pupils are equal, round, and reactive to light.  Neck:     Thyroid: No thyroid  mass.  Cardiovascular:     Rate and Rhythm: Normal rate and regular rhythm.  Pulmonary:     Effort: Pulmonary effort is normal.     Breath sounds: Normal breath sounds.  Abdominal:     General: Abdomen is flat. Bowel sounds are normal.     Palpations: Abdomen is soft.  Musculoskeletal:        General: Normal range of motion.  Lymphadenopathy:     Cervical:     Right cervical: No superficial cervical  adenopathy.    Left cervical: No superficial cervical adenopathy.  Skin:    General: Skin is warm.     Capillary Refill: Capillary refill takes less than 2 seconds.  Neurological:     General: No focal deficit present.     Mental Status: She is alert and oriented to person, place, and time.  Psychiatric:        Mood and Affect: Mood normal.        Behavior: Behavior normal.        Thought Content: Thought content normal.        Judgment: Judgment normal.     BP 100/70 (BP Location: Left Arm, Patient Position: Sitting)   Pulse 70   Temp (!) 97.1 F (36.2 C) (Skin)   Ht 5' 5.6" (1.666 m)   Wt 129 lb 6 oz (58.7 kg)   LMP 11/22/2022   SpO2 97%   BMI 21.14 kg/m  Wt Readings from Last 3 Encounters:  12/01/22 129 lb 6 oz (58.7 kg)  08/27/21 165 lb (74.8 kg)  09/03/21 164 lb 3.2 oz (74.5 kg)     There are no preventive care reminders to display for this patient.   There are no preventive care reminders to display for this patient.  No results found for: "TSH" Lab Results  Component Value Date   WBC 13.3 (H) 09/04/2021   HGB 9.2 (L) 09/04/2021   HCT 27.9 (L) 09/04/2021   MCV 88.9 09/04/2021   PLT 166 09/04/2021   No results found for: "NA", "K", "CHLORIDE", "CO2", "GLUCOSE", "BUN", "CREATININE", "BILITOT", "ALKPHOS", "AST", "ALT", "PROT", "ALBUMIN", "CALCIUM", "ANIONGAP", "EGFR", "GFR" No results found for: "CHOL" No results found for: "HDL" No results found for: "LDLCALC" No results found for: "TRIG" No results found for: "CHOLHDL" No results found for:  "HGBA1C"    Assessment & Plan:   Problem List Items Addressed This Visit       Other   Iron deficiency anemia secondary to inadequate dietary iron intake - Primary    Stable however ordering cbc, pending results. Continue prenatal vitamin daily.       Relevant Orders   CBC   GAD (generalized anxiety disorder)    Long d/w pt on options.  Can consider lexapro in the future, pt will consider this.  Pt is breastfeeding so limited options.  Did suggest establishing with a therapist to talk about problems at home and increased stress/anxiety.  F/u with me if needing anything further.       Breastfeeding (infant)   Encounter for general adult medical examination without abnormal findings    Patient Counseling(The following topics were reviewed):  Preventative care handout given to pt  Health maintenance and immunizations reviewed. Please refer to Health maintenance section. Pt advised on safe sex, wearing seatbelts in car, and proper nutrition labwork ordered today for annual Dental health: Discussed importance of regular tooth brushing, flossing, and dental visits.  Ordered cbc bmp and lipid panel, pending results.      Relevant Orders   CBC   Basic metabolic panel   Lipid panel   RESOLVED: History of anxiety   Other Visit Diagnoses     Screening for lipoid disorders       Relevant Orders   Lipid panel       No orders of the defined types were placed in this encounter.   Follow-up: Return in about 1 year (around 12/02/2023) for f/u CPE.    Eugenia Pancoast, FNP

## 2022-12-01 NOTE — Assessment & Plan Note (Signed)
Stable however ordering cbc, pending results. Continue prenatal vitamin daily.

## 2022-12-01 NOTE — Assessment & Plan Note (Signed)
Long d/w pt on options.  Can consider lexapro in the future, pt will consider this.  Pt is breastfeeding so limited options.  Did suggest establishing with a therapist to talk about problems at home and increased stress/anxiety.  F/u with me if needing anything further.

## 2022-12-01 NOTE — Assessment & Plan Note (Signed)
Patient Counseling(The following topics were reviewed):  Preventative care handout given to pt  Health maintenance and immunizations reviewed. Please refer to Health maintenance section. Pt advised on safe sex, wearing seatbelts in car, and proper nutrition labwork ordered today for annual Dental health: Discussed importance of regular tooth brushing, flossing, and dental visits.  Ordered cbc bmp and lipid panel, pending results.

## 2022-12-01 NOTE — Patient Instructions (Addendum)
  Recommend you get connected with [facility] (April Hope McCoy) and Gerald Stabs on teleheath for our [employer].  To recap, lexapro would be ok with breastfeeding in case you want/decide to take this route. If you were to try lexapro again you would start taking 1/2 of a 10 mg tablet (5 mg total) once daily for one week, and then if tolerating well you would then increase to 10 mg once daily. If you do stay on 10 mg and like it and decide to maintain, follow up with me in about 1-2 months after starting so we can discuss how you are doing.  ------------------------------------  I have created an order for lab work today during our visit.  Please schedule an appointment on your way out to return to the lab at your convenience. Please return fasting at your lab appointment (meaning you can only drink black coffee and or water prior to your appointment). I will reach out to you in regards to the labs when I receive the results.     ------------------------------------   Recommendations on keeping yourself healthy:  - Exercise at least 30-45 minutes a day, 3-4 days a week.  - Eat a low-fat diet with lots of fruits and vegetables, up to 7-9 servings per day.  - Seatbelts can save your life. Wear them always.  - Smoke detectors on every level of your home, check batteries every year.  - Eye Doctor - have an eye exam every 1-2 years  - Safe sex - if you may be exposed to STDs, use a condom.  - Alcohol -  If you drink, do it moderately, less than 2 drinks per day.  - [place]. Choose someone to speak for you if you are not able.  - Depression is common in our stressful world.If you're feeling down or losing interest in things you normally enjoy, please come in for a visit.  - Violence - If anyone is threatening or hurting you, please call immediately.  Due to recent changes in healthcare laws, you may see results of your imaging and/or laboratory studies on MyChart before I have had a  chance to review them.  I understand that in some cases there may be results that are confusing or concerning to you. Please understand that not all results are received at the same time and often I may need to interpret multiple results in order to provide you with the best plan of care or course of treatment. Therefore, I ask that you please give me 2 business days to thoroughly review all your results before contacting my office for clarification. Should we see a critical lab result, you will be contacted sooner.   I will see you again in one year for your annual comprehensive exam unless otherwise stated and or with acute concerns.  It was a pleasure seeing you today! Please do not hesitate to reach out with any questions and or concerns.  Regards,   Eugenia Pancoast

## 2023-02-08 ENCOUNTER — Other Ambulatory Visit: Payer: Self-pay

## 2023-02-08 MED ORDER — OSELTAMIVIR PHOSPHATE 75 MG PO CAPS
75.0000 mg | ORAL_CAPSULE | Freq: Two times a day (BID) | ORAL | 0 refills | Status: DC
  Filled 2023-02-08: qty 10, 5d supply, fill #0

## 2023-03-25 ENCOUNTER — Ambulatory Visit (INDEPENDENT_AMBULATORY_CARE_PROVIDER_SITE_OTHER): Payer: 59 | Admitting: Internal Medicine

## 2023-03-25 ENCOUNTER — Other Ambulatory Visit: Payer: Self-pay

## 2023-03-25 ENCOUNTER — Encounter: Payer: Self-pay | Admitting: Internal Medicine

## 2023-03-25 VITALS — BP 102/70 | HR 79 | Temp 97.4°F | Ht 65.5 in | Wt 133.0 lb

## 2023-03-25 DIAGNOSIS — J029 Acute pharyngitis, unspecified: Secondary | ICD-10-CM | POA: Diagnosis not present

## 2023-03-25 MED ORDER — NYSTATIN 100000 UNIT/ML MT SUSP
5.0000 mL | Freq: Four times a day (QID) | OROMUCOSAL | 2 refills | Status: DC
  Filled 2023-03-25: qty 120, 6d supply, fill #0

## 2023-03-25 MED ORDER — FLUCONAZOLE 150 MG PO TABS
150.0000 mg | ORAL_TABLET | ORAL | 1 refills | Status: DC
  Filled 2023-03-25: qty 2, 14d supply, fill #0

## 2023-03-25 NOTE — Progress Notes (Signed)
Subjective:    Patient ID: Alicia Lloyd, female    DOB: [birth year], 37 y.o.   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     [DISCONTINUED] progesterone (PROMETRIUM) 200 MG capsule Take 1 capsule every day by mouth at bedtime for 60 days. 60 capsule 0   No current facility-administered medications on file prior to visit.    Allergies  Allergen Reactions   Lexapro [Escitalopram] Other (See Comments)    Decreased libido     Past Medical History:  Diagnosis Date   H/O LEEP    History of HPV infection    Postpartum care following cesarean delivery (1/25) 12/17/2014   Thrombocythemia    Vaginal Pap smear, abnormal     Past Surgical History:  Procedure Laterality Date   CESAREAN SECTION N/A 12/17/2014   Procedure: Primary CESAREAN SECTION;  Surgeon: Serita Kyle, MD;  Location: [facility];  Service: Obstetrics;  Laterality: N/A;  EDD: 12/24/14   CESAREAN SECTION N/A 09/03/2021   Procedure: Repeat CESAREAN SECTION;  Surgeon: Shea Evans, MD;  Location: [facility];  Service: Obstetrics;  Laterality: N/A;  EDD: 09/14/21   DILATION AND CURETTAGE OF UTERUS  11/23/2006   aborption   LEEP  11/23/2006   WISDOM TOOTH EXTRACTION      Family History  Problem Relation Age of Onset   Stroke Mother        TIA, smoker   Uterine cancer Mother    Celiac disease Father     Cholecystitis Father    Lung cancer Maternal Grandmother     Social History   Socioeconomic History   Marital status: Married    Spouse name: Not on file   Number of children: Not on file   Years of education: Not on file   Highest education level: Not on file  Occupational History    Employer: [employer]   Tobacco Use   Smoking status: Never   Smokeless tobacco: Never  Vaping Use   Vaping Use: Never used  Substance and Sexual Activity   Alcohol use: No   Drug use: No   Sexual activity: Yes    Partners: Male    Birth control/protection: Surgical    Comment: husband vasectomy  Other Topics Concern   Not on file  Social History Narrative   Not on file   Social Determinants of Health   Financial Resource Strain: Not on file  Food Insecurity: Not on file  Transportation Needs: Not on file  Physical Activity: Not on file  Stress: Not on file  Social Connections: Not on file  Intimate Partner Violence: Not on file   Review of Systems No recent antibiotics Using flonase recently---and cold tablet also     Objective:   Physical Exam Constitutional:      Appearance: Normal appearance.  HENT:     Mouth/Throat:     Comments: Very  slight redness in pharynx--no exudates or tonsillar enlargement Tongue looks okay Musculoskeletal:     Cervical back: Neck supple.  Lymphadenopathy:     Cervical: No cervical adenopathy.  Neurological:     Mental Status: She is alert.            Assessment & Plan:

## 2023-03-25 NOTE — Assessment & Plan Note (Signed)
Also with tongue pain Not much findings right now on exam No reason for her to have thrush--but it is possible Will use nystatin swish and swallow Already on MVI with zinc Fluconazole 150mg  weekly with repeat prn

## 2023-12-19 ENCOUNTER — Other Ambulatory Visit: Payer: Self-pay | Admitting: Family

## 2023-12-19 DIAGNOSIS — J0100 Acute maxillary sinusitis, unspecified: Secondary | ICD-10-CM

## 2023-12-19 MED ORDER — AMOXICILLIN-POT CLAVULANATE 875-125 MG PO TABS
1.0000 | ORAL_TABLET | Freq: Two times a day (BID) | ORAL | 0 refills | Status: AC
Start: 2023-12-19 — End: ?

## 2024-03-20 DIAGNOSIS — R1084 Generalized abdominal pain: Secondary | ICD-10-CM | POA: Diagnosis not present

## 2024-04-24 ENCOUNTER — Telehealth: Payer: Self-pay | Admitting: Family

## 2024-04-24 NOTE — Telephone Encounter (Signed)
 Spoke to pt, pt states she needed cpe before 6/20. Pt mentioned she attends the same church as Dugal. Scheduled cpe for 6/18

## 2024-04-24 NOTE — Telephone Encounter (Signed)
 Book her in a 40 minute slot for a CPE Preferable not on a day where I am already over my session limits for Cpe/NP

## 2024-04-24 NOTE — Telephone Encounter (Signed)
 Copied from CRM [id number]. Topic: Appointments - Scheduling Inquiry for Clinic >> Apr 24, 2024  8:07 AM Deaijah H wrote: Reason for CRM: Patient called in needing a physical completed by the end of June for insurance purposes advised Tues. Sept 9th is the first available. Patient would like to know if there is any available spots in June by chance and to also have blood work done for physical. Please call [phone]

## 2024-05-09 DIAGNOSIS — Z124 Encounter for screening for malignant neoplasm of cervix: Secondary | ICD-10-CM | POA: Diagnosis not present

## 2024-05-09 DIAGNOSIS — Z113 Encounter for screening for infections with a predominantly sexual mode of transmission: Secondary | ICD-10-CM | POA: Diagnosis not present

## 2024-05-09 DIAGNOSIS — Z01411 Encounter for gynecological examination (general) (routine) with abnormal findings: Secondary | ICD-10-CM | POA: Diagnosis not present

## 2024-05-09 DIAGNOSIS — Z01419 Encounter for gynecological examination (general) (routine) without abnormal findings: Secondary | ICD-10-CM | POA: Diagnosis not present

## 2024-05-09 DIAGNOSIS — Z1331 Encounter for screening for depression: Secondary | ICD-10-CM | POA: Diagnosis not present

## 2024-05-10 ENCOUNTER — Ambulatory Visit (INDEPENDENT_AMBULATORY_CARE_PROVIDER_SITE_OTHER): Admitting: Family

## 2024-05-10 ENCOUNTER — Other Ambulatory Visit: Payer: Self-pay

## 2024-05-10 ENCOUNTER — Ambulatory Visit: Payer: Self-pay | Admitting: Family

## 2024-05-10 ENCOUNTER — Encounter: Payer: Self-pay | Admitting: Family

## 2024-05-10 VITALS — BP 102/66 | HR 66 | Temp 98.0°F | Ht 65.0 in | Wt 137.8 lb

## 2024-05-10 DIAGNOSIS — Z124 Encounter for screening for malignant neoplasm of cervix: Secondary | ICD-10-CM

## 2024-05-10 DIAGNOSIS — Z0000 Encounter for general adult medical examination without abnormal findings: Secondary | ICD-10-CM | POA: Diagnosis not present

## 2024-05-10 DIAGNOSIS — D508 Other iron deficiency anemias: Secondary | ICD-10-CM

## 2024-05-10 DIAGNOSIS — E559 Vitamin D deficiency, unspecified: Secondary | ICD-10-CM | POA: Insufficient documentation

## 2024-05-10 DIAGNOSIS — F411 Generalized anxiety disorder: Secondary | ICD-10-CM | POA: Diagnosis not present

## 2024-05-10 DIAGNOSIS — R5383 Other fatigue: Secondary | ICD-10-CM

## 2024-05-10 DIAGNOSIS — Z13220 Encounter for screening for lipoid disorders: Secondary | ICD-10-CM | POA: Diagnosis not present

## 2024-05-10 LAB — VITAMIN D 25 HYDROXY (VIT D DEFICIENCY, FRACTURES): VITD: 36.01 ng/mL (ref 30.00–100.00)

## 2024-05-10 LAB — CBC
HCT: 40.7 % (ref 36.0–46.0)
Hemoglobin: 13.5 g/dL (ref 12.0–15.0)
MCHC: 33.2 g/dL (ref 30.0–36.0)
MCV: 91.2 fl (ref 78.0–100.0)
Platelets: 187 10*3/uL (ref 150.0–400.0)
RBC: 4.46 Mil/uL (ref 3.87–5.11)
RDW: 12.7 % (ref 11.5–15.5)
WBC: 4.4 10*3/uL (ref 4.0–10.5)

## 2024-05-10 LAB — COMPREHENSIVE METABOLIC PANEL WITH GFR
ALT: 9 U/L (ref 0–35)
AST: 14 U/L (ref 0–37)
Albumin: 4.5 g/dL (ref 3.5–5.2)
Alkaline Phosphatase: 46 U/L (ref 39–117)
BUN: 15 mg/dL (ref 6–23)
CO2: 29 meq/L (ref 19–32)
Calcium: 9.4 mg/dL (ref 8.4–10.5)
Chloride: 105 meq/L (ref 96–112)
Creatinine, Ser: 0.65 mg/dL (ref 0.40–1.20)
GFR: 111.8 mL/min (ref >60.00)
Glucose, Bld: 84 mg/dL (ref 70–99)
Potassium: 4.5 meq/L (ref 3.5–5.1)
Sodium: 139 meq/L (ref 135–145)
Total Bilirubin: 0.9 mg/dL (ref 0.2–1.2)
Total Protein: 6.8 g/dL (ref 6.0–8.3)

## 2024-05-10 LAB — LIPID PANEL
Cholesterol: 143 mg/dL (ref 0–200)
HDL: 61.9 mg/dL (ref >39.00)
LDL Cholesterol: 72 mg/dL (ref 0–99)
NonHDL: 80.6
Total CHOL/HDL Ratio: 2
Triglycerides: 42 mg/dL (ref 0.0–149.0)
VLDL: 8.4 mg/dL (ref 0.0–40.0)

## 2024-05-10 LAB — TSH: TSH: 0.81 u[IU]/mL (ref 0.35–5.50)

## 2024-05-10 LAB — IBC + FERRITIN
Ferritin: 15.3 ng/mL (ref 10.0–291.0)
Iron: 204 ug/dL — ABNORMAL HIGH (ref 42–145)
Saturation Ratios: 56.5 % — ABNORMAL HIGH (ref 20.0–50.0)
TIBC: 361.2 ug/dL (ref 250.0–450.0)
Transferrin: 258 mg/dL (ref 212.0–360.0)

## 2024-05-10 LAB — VITAMIN B12: Vitamin B-12: 608 pg/mL (ref 211–911)

## 2024-05-10 MED ORDER — BUSPIRONE HCL 5 MG PO TABS
5.0000 mg | ORAL_TABLET | Freq: Two times a day (BID) | ORAL | 1 refills | Status: AC
  Filled 2024-05-10: qty 60, 30d supply, fill #0

## 2024-05-10 NOTE — Assessment & Plan Note (Signed)
 Cbc ibc ferritin ordered pending results.

## 2024-05-10 NOTE — Assessment & Plan Note (Signed)
 Ordered vitamin d pending results.

## 2024-05-10 NOTE — Assessment & Plan Note (Signed)
 B12 cbc ordered pending

## 2024-05-10 NOTE — Assessment & Plan Note (Signed)

## 2024-05-10 NOTE — Progress Notes (Signed)
 Subjective:  Patient ID: Alicia Lloyd, female    DOB: 06-25-1986  Age: 38 y.o. MRN: [id number]  Patient Care Team: Felicita Horns, FNP as PCP - General (Family Medicine)   CC:  Chief Complaint  Patient presents with   Annual Exam    HPI Alicia Lloyd is a 38 y.o. female who presents today for an annual physical exam. She reports consuming a general diet. Twice a week exercise works out where she works, free Run[employer], broadcasting/film/video classes at work HIIT 30 minutes  She generally feels well. She reports sleeping well. She does have additional problems to discuss today.   Vision:Within last year Dental:Receives regular dental care  Last pap: 05/10/24 Dr. Luisa Sails at [facility]  Pt is with acute concerns.   Easily agitated at home and would like to try something to help her 'take the edge off' as there are stressors at home. She has tried lexapro  in the past but it caused low libido.    Advanced Directives Patient does have advanced directives and it is in the chart.     DEPRESSION SCREENING    05/10/2024    8:42 AM  PHQ 2/9 Scores  PHQ - 2 Score 0  PHQ- 9 Score 0     ROS: Negative unless specifically indicated above in HPI.    Current Outpatient Medications:    busPIRone (BUSPAR) 5 MG tablet, Take 1 tablet (5 mg total) by mouth 2 (two) times daily., Disp: 60 tablet, Rfl: 1   Prenatal Vit-Fe Fumarate-FA (PRENATAL MULTIVITAMIN) TABS tablet, Take 1 tablet by mouth daily at 12 noon., Disp: , Rfl:     Objective:    BP 102/66 (BP Location: Right Arm, Patient Position: Sitting, Cuff Size: Normal)   Pulse 66   Temp 98 F (36.7 C) (Temporal)   Ht 5' 5 (1.651 m)   Wt 137 lb 12.8 oz (62.5 kg)   LMP 04/30/2024 (Exact Date)   SpO2 98%   Breastfeeding No   BMI 22.93 kg/m   BP Readings from Last 3 Encounters:  05/10/24 102/66  03/25/23 102/70  12/01/22 100/70      Physical Exam Constitutional:      General: She is not in acute distress.    Appearance: Normal  appearance. She is normal weight. She is not ill-appearing.  HENT:     Head: Normocephalic.     Right Ear: Tympanic membrane normal.     Left Ear: Tympanic membrane normal.     Nose: Nose normal.     Mouth/Throat:     Mouth: Mucous membranes are moist.   Eyes:     Extraocular Movements: Extraocular movements intact.     Pupils: Pupils are equal, round, and reactive to light.    Cardiovascular:     Rate and Rhythm: Normal rate and regular rhythm.  Pulmonary:     Effort: Pulmonary effort is normal.     Breath sounds: Normal breath sounds.  Abdominal:     General: Abdomen is flat. Bowel sounds are normal.     Palpations: Abdomen is soft.     Tenderness: There is no guarding or rebound.   Musculoskeletal:        General: Normal range of motion.     Cervical back: Normal range of motion.   Skin:    General: Skin is warm.     Capillary Refill: Capillary refill takes less than 2 seconds.   Neurological:     General: No focal deficit present.  Mental Status: She is alert.   Psychiatric:        Mood and Affect: Mood normal.        Behavior: Behavior normal.        Thought Content: Thought content normal.        Judgment: Judgment normal.          Assessment & Plan:  GAD (generalized anxiety disorder) Assessment & Plan: Trial buspirone Didn't like lexapro   Could consider sertraline if buspirone not helpful   Orders: -     busPIRone HCl; Take 1 tablet (5 mg total) by mouth 2 (two) times daily.  Dispense: 60 tablet; Refill: 1  Encounter for general adult medical examination without abnormal findings Assessment & Plan: Patient Counseling(The following topics were reviewed):  Preventative care handout given to pt  Health maintenance and immunizations reviewed. Please refer to Health maintenance section. Pt advised on safe sex, wearing seatbelts in car, and proper nutrition labwork ordered today for annual Dental health: Discussed importance of regular tooth  brushing, flossing, and dental visits.    Orders: -     Lipid panel -     CBC -     TSH -     Comprehensive metabolic panel with GFR  Cervical cancer screening  Other fatigue Assessment & Plan: B12 cbc ordered pending   Orders: -     Vitamin B12  Vitamin D deficiency Assessment & Plan: Ordered vitamin d pending results.    Orders: -     Vitamin B12 -     VITAMIN D 25 Hydroxy (Vit-D Deficiency, Fractures)  Screening for lipoid disorders -     Lipid panel  Iron  deficiency anemia secondary to inadequate dietary iron  intake Assessment & Plan: Cbc ibc ferritin ordered pending results.   Orders: -     CBC -     IBC + Ferritin      Follow-up: Return in about 1 year (around 05/10/2025) for f/u CPE.   Felicita Horns, FNP

## 2024-05-10 NOTE — Assessment & Plan Note (Signed)
 Trial buspirone Didn't like lexapro   Could consider sertraline if buspirone not helpful
